# Patient Record
Sex: Female | Born: 1945 | Race: Black or African American | Hispanic: No | Smoking: Never smoker
Health system: Southern US, Community
[De-identification: ages and names within clinical notes are randomized; demographics above are authoritative.]

## PROBLEM LIST (undated history)

## (undated) DIAGNOSIS — I10 Essential (primary) hypertension: Secondary | ICD-10-CM

## (undated) DIAGNOSIS — C55 Malignant neoplasm of uterus, part unspecified: Secondary | ICD-10-CM

## (undated) DIAGNOSIS — E119 Type 2 diabetes mellitus without complications: Secondary | ICD-10-CM

## (undated) DIAGNOSIS — Z9221 Personal history of antineoplastic chemotherapy: Secondary | ICD-10-CM

## (undated) HISTORY — DX: Essential (primary) hypertension: I10

## (undated) HISTORY — DX: Type 2 diabetes mellitus without complications: E11.9

---

## 1978-01-20 HISTORY — PX: TUBAL LIGATION: SHX77

## 2003-02-27 ENCOUNTER — Other Ambulatory Visit: Payer: Self-pay

## 2006-05-07 ENCOUNTER — Ambulatory Visit: Payer: Self-pay

## 2007-03-02 ENCOUNTER — Ambulatory Visit: Payer: Self-pay

## 2009-02-12 ENCOUNTER — Ambulatory Visit: Payer: Self-pay

## 2009-06-28 ENCOUNTER — Emergency Department: Payer: Self-pay | Admitting: Emergency Medicine

## 2010-09-19 ENCOUNTER — Emergency Department: Payer: Self-pay

## 2010-11-07 ENCOUNTER — Ambulatory Visit: Payer: Self-pay | Admitting: Orthopedic Surgery

## 2011-01-21 HISTORY — PX: HAND SURGERY: SHX662

## 2011-03-20 ENCOUNTER — Ambulatory Visit: Payer: Self-pay | Admitting: Internal Medicine

## 2012-04-09 ENCOUNTER — Emergency Department: Payer: Self-pay | Admitting: Emergency Medicine

## 2012-09-03 ENCOUNTER — Ambulatory Visit: Payer: Self-pay | Admitting: Internal Medicine

## 2012-09-08 ENCOUNTER — Ambulatory Visit: Payer: Self-pay | Admitting: General Surgery

## 2012-09-21 ENCOUNTER — Encounter: Payer: Self-pay | Admitting: General Surgery

## 2012-09-21 ENCOUNTER — Other Ambulatory Visit: Payer: Self-pay | Admitting: General Surgery

## 2012-09-21 ENCOUNTER — Ambulatory Visit (INDEPENDENT_AMBULATORY_CARE_PROVIDER_SITE_OTHER): Payer: Medicare Other | Admitting: General Surgery

## 2012-09-21 ENCOUNTER — Ambulatory Visit: Payer: Self-pay | Admitting: General Surgery

## 2012-09-21 VITALS — BP 140/70 | HR 72 | Resp 14 | Ht 69.0 in | Wt 203.0 lb

## 2012-09-21 DIAGNOSIS — K8070 Calculus of gallbladder and bile duct without cholecystitis without obstruction: Secondary | ICD-10-CM

## 2012-09-21 DIAGNOSIS — K429 Umbilical hernia without obstruction or gangrene: Secondary | ICD-10-CM

## 2012-09-21 NOTE — Patient Instructions (Addendum)
Laparoscopic Cholecystectomy Laparoscopic cholecystectomy is surgery to remove the gallbladder. The gallbladder is located slightly to the right of center in the abdomen, behind the liver. It is a concentrating and storage sac for the bile produced in the liver. Bile aids in the digestion and absorption of fats. Gallbladder disease (cholecystitis) is an inflammation of your gallbladder. This condition is usually caused by a buildup of gallstones (cholelithiasis) in your gallbladder. Gallstones can block the flow of bile, resulting in inflammation and pain. In severe cases, emergency surgery may be required. When emergency surgery is not required, you will have time to prepare for the procedure. Laparoscopic surgery is an alternative to open surgery. Laparoscopic surgery usually has a shorter recovery time. Your common bile duct may also need to be examined and explored. Your caregiver will discuss this with you if he or she feels this should be done. If stones are found in the common bile duct, they may be removed. LET YOUR CAREGIVER KNOW ABOUT:  Allergies to food or medicine.  Medicines taken, including vitamins, herbs, eyedrops, over-the-counter medicines, and creams.  Use of steroids (by mouth or creams).  Previous problems with anesthetics or numbing medicines.  History of bleeding problems or blood clots.  Previous surgery.  Other health problems, including diabetes and kidney problems.  Possibility of pregnancy, if this applies. RISKS AND COMPLICATIONS All surgery is associated with risks. Some problems that may occur following this procedure include:  Infection.  Damage to the common bile duct, nerves, arteries, veins, or other internal organs such as the stomach or intestines.  Bleeding.  A stone may remain in the common bile duct. BEFORE THE PROCEDURE  Do not take aspirin for 3 days prior to surgery or blood thinners for 1 week prior to surgery.  Do not eat or drink  anything after midnight the night before surgery.  Let your caregiver know if you develop a cold or other infectious problem prior to surgery.  You should be present 60 minutes before the procedure or as directed. PROCEDURE  You will be given medicine that makes you sleep (general anesthetic). When you are asleep, your surgeon will make several small cuts (incisions) in your abdomen. One of these incisions is used to insert a small, lighted scope (laparoscope) into the abdomen. The laparoscope helps the surgeon see into your abdomen. Carbon dioxide gas will be pumped into your abdomen. The gas allows more room for the surgeon to perform your surgery. Other operating instruments are inserted through the other incisions. Laparoscopic procedures may not be appropriate when:  There is major scarring from previous surgery.  The gallbladder is extremely inflamed.  There are bleeding disorders or unexpected cirrhosis of the liver.  A pregnancy is near term.  Other conditions make the laparoscopic procedure impossible. If your surgeon feels it is not safe to continue with a laparoscopic procedure, he or she will perform an open abdominal procedure. In this case, the surgeon will make an incision to open the abdomen. This gives the surgeon a larger view and field to work within. This may allow the surgeon to perform procedures that sometimes cannot be performed with a laparoscope alone. Open surgery has a longer recovery time. AFTER THE PROCEDURE  You will be taken to the recovery area where a nurse will watch and check your progress.  You may be allowed to go home the same day.  Do not resume physical activities until directed by your caregiver.  You may resume a normal diet and   activities as directed. Document Released: 01/06/2005 Document Revised: 03/31/2011 Document Reviewed: 06/21/2010 [facility] Patient Information 2014 [place], [place].  Endoscopic Retrograde Cholangiopancreatography  (ERCP) ERCP stands for endoscopic retrograde cholangiopancreatography. In this procedure, a thin, lighted tube (endoscope) is used. It is passed through the mouth and down the back of the throat into the upper part of the intestine, called the duodenum. A small, plastic tube (cannula) is then passed through the endoscope. It is directed into the bile duct or pancreatic duct. Dye is then injected through the tube. X-rays are taken to study the biliary and pancreatic passageways. This procedure is used to diagnosis many diseases of the pancreas, bile ducts, liver, and gallbladder. LET YOUR CAREGIVER KNOW ABOUT:   Allergies to food or medicine.  Medicines taken, including vitamins, herbs, eyedrops, over-the-counter medicines, and creams.  Use of steroids (by mouth or creams).  Previous problems with anesthetics or numbing medicines.  History of bleeding problems or blood clots.  Previous surgery.  Other health problems, including diabetes and kidney problems.  Possibility of pregnancy, if this applies.  Any barium X-rays during the past week. BEFORE THE PROCEDURE   Do not eat or drink anything, including water, for at least 6 hours before the procedure.  Ask your caregiver whether you should stop taking certain medicines prior to your procedure.  Arrange for someone to drive you home. You will not be allowed to drive for several hours after the procedure.  Arrive at least 60 minutes before the procedure or as directed. This will give you time to check in and fill out any necessary paperwork. PROCEDURE   You will be given medicine through a vein (intravenously) to make you relaxed and sleepy.  You might have a breathing tube placed to give you medicine that makes you sleep (general anesthetic).  Your throat may be sprayed with medicine that numbs the area (local anesthetic).  You will lie on your left side. The endoscope will be inserted through your mouth and into the duodenum. The  tube will not interfere with your breathing. Gagging is prevented by the anesthesia.  While X-rays are being taken, you may be positioned on your stomach. During the ERCP, the person performing the procedure may identify a blockage or narrowed opening to the bile duct. A muscular portion of the main bile duct may be partially cut (sphincterotomy). A thin, plastic tube (stent) will be positioned inside your bile duct. This is to allow fluid secreted by the liver (bile) to flow more easily through the narrowed opening. AFTER THE PROCEDURE   You will rest in bed until you are fully conscious.  When you first wake up, your throat may feel slightly sore.  You will not be allowed to eat or drink until numbness subsides.  Once you are able to drink, urinate, and sit on the edge of the bed without feeling sick to your stomach (nauseous) or dizzy, you may be allowed to go home.  Have a friend or family member with you for the first 24 hours after your procedure. SEEK MEDICAL CARE IF:   You have an oral temperature above 102 F (38.9 C).  You develop other signs of infection, including chills or feeling unwell.  You have abdominal pain.  You have questions or concerns. MAKE SURE YOU:   Understand these instructions.  Will watch your condition.  Will get help right away if you are not doing well or get worse. Document Released: 10/01/2000 Document Revised: 03/31/2011 Document Reviewed: 04/24/2009 [employer]  Patient Information 2014 [place], [place].  Patient to have labs completed at [facility] today.

## 2012-09-21 NOTE — Progress Notes (Signed)
Patient ID: Frances [name], female   DOB: [birth year], 67 y.o.   MRN: [id number]  Chief Complaint  Patient presents with  . Other    gallstones    HPI Frances [name] is a 67 y.o. female who presents for an evaluation of gallstones. States for about a month she has been having right abdominal pain that radiates to her back.  She has had her gall bladder "flare up" in the past off and on for about 20 years. 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  She does state that the pain episodes are worse with fried foods. Denies vomiting and the nausea doesn't seem to last long. Ultrasound was done 09-03-12.   HPI  Past Medical History  Diagnosis Date  . Hypertension   . Diabetes mellitus without complication     Past Surgical History  Procedure Laterality Date  . Tubal ligation  1980  . Hand surgery Left 2013    Carpel Tunnel     History reviewed. No pertinent family history.  Social History History  Substance Use Topics  . Smoking status: Never Smoker   . Smokeless tobacco: Not on file  . Alcohol Use: No    No Known Allergies  Current Outpatient Prescriptions  Medication Sig Dispense Refill  . amoxicillin (AMOXIL) 500 MG capsule Take 3 capsules by mouth daily.      Marland Kitchen aspirin 81 MG tablet Take 81 mg by mouth daily.      . hydrochlorothiazide (HYDRODIURIL) 25 MG tablet Take 1 tablet by mouth daily.      Marland Kitchen lisinopril (PRINIVIL,ZESTRIL) 40 MG tablet Take 1 tablet by mouth daily.      . metFORMIN (GLUCOPHAGE) 500 MG tablet Take 1 tablet by mouth daily.      . verapamil (VERELAN PM) 240 MG 24 hr capsule Take 1 capsule by mouth daily.       No current facility-administered medications for this visit.    Review of Systems Review of Systems  Constitutional: Negative.   Respiratory: Negative.   Cardiovascular: Negative.   Gastrointestinal: Positive for nausea.    Blood pressure 140/70, pulse 72, resp. rate 14, height 5\' 9"  (1.753 m), weight 203 lb (92.08  kg).  Physical Exam Physical Exam  Constitutional: She is oriented to person, place, and time. She appears [name]-developed and [name]-nourished.  Eyes: Conjunctivae are normal. No scleral icterus.  Neck: Neck supple.  Cardiovascular: Normal rate and regular rhythm.   Occasional drop beat  Pulmonary/Chest: Effort normal and breath sounds normal.  Abdominal: Soft. Normal appearance. There is no tenderness. A hernia (umbilical, 3-4 cm nontender partically reducible ) is present.  Lymphadenopathy:    She has no cervical adenopathy.  Neurological: She is alert and oriented to person, place, and time.  Skin: Skin is warm and dry.    Data Reviewed Ultrasound reviewed showed multiple stones including some in common duct.  Assessment    Patient with biliary colic. Umbilical hernia present.    Plan    LFT's, lipase, if normal can proceed with laparoscopic cholecystectomy and cholangiogram may need ERCP after. If LFT are abnormal then start with ERCP and then surgery.     Patient to have the following labs completed at [facility] today: hepatic function panel A, CBC, and lipase.  Kaydn Kumpf G 09/22/2012, 6:39 AM

## 2012-09-22 ENCOUNTER — Encounter: Payer: Self-pay | Admitting: General Surgery

## 2012-09-22 ENCOUNTER — Telehealth: Payer: Self-pay | Admitting: *Deleted

## 2012-09-22 LAB — HEPATIC FUNCTION PANEL
AST: 20 IU/L (ref 0–40)
Albumin: 4.1 g/dL (ref 3.6–4.8)
Total Bilirubin: 0.2 mg/dL (ref 0.0–1.2)
Total Protein: 6.2 g/dL (ref 6.0–8.5)

## 2012-09-22 LAB — CBC WITH DIFFERENTIAL/PLATELET
Basophils Absolute: 0 10*3/uL (ref 0.0–0.2)
Eos: 4 % (ref 0–5)
HCT: 36.2 % (ref 34.0–46.6)
Immature Grans (Abs): 0 10*3/uL (ref 0.0–0.1)
Immature Granulocytes: 0 % (ref 0–2)
Lymphocytes Absolute: 2.1 10*3/uL (ref 0.7–3.1)
Lymphs: 49 % — ABNORMAL HIGH (ref 14–46)
Monocytes: 8 % (ref 4–12)
RDW: 13.7 % (ref 12.3–15.4)
WBC: 4.2 10*3/uL (ref 3.4–10.8)

## 2012-09-22 NOTE — Telephone Encounter (Signed)
Patient was contacted to arrange gallbladder surgery. She has a daughter that is in the hospital fixing to deliver and wishes to call the office back by the end of the week to arrange surgery.

## 2012-09-24 ENCOUNTER — Telehealth: Payer: Self-pay | Admitting: *Deleted

## 2012-09-24 NOTE — Telephone Encounter (Signed)
Message copied by Nicholes Mango on Fri Sep 24, 2012 10:51 AM ------      Message from: Kieth Brightly      Created: Wed Sep 22, 2012  6:38 AM       LFTs and lipase are normal. Can schedule lap/cholecystectomy with cholangiogram. CBC is abnormal. Need to to get previous CBC if on is available from her PCP Please check on this. ------

## 2012-09-24 NOTE — Telephone Encounter (Signed)
After three phone calls, Dr. Fredna Dow office finally reports that they do not have a previous CBC result on this patient. Patient will be calling to arrange surgery.Thanks.

## 2012-10-01 ENCOUNTER — Telehealth: Payer: Self-pay | Admitting: *Deleted

## 2012-10-01 NOTE — Telephone Encounter (Signed)
Message left for patient to call the office since she has not called back to arrange gallbladder surgery.

## 2012-10-01 NOTE — Telephone Encounter (Signed)
Message copied by Nicholes Mango on Fri Oct 01, 2012  8:56 AM ------      Message from: Kieth Brightly      Created: Wed Sep 22, 2012  6:38 AM       LFTs and lipase are normal. Can schedule lap/cholecystectomy with cholangiogram. CBC is abnormal. Need to to get previous CBC if on is available from her PCP Please check on this. ------

## 2012-10-04 ENCOUNTER — Telehealth: Payer: Self-pay | Admitting: *Deleted

## 2012-10-04 NOTE — Telephone Encounter (Signed)
Patient's surgery has been scheduled for 11-08-12 at [facility]. Paperwork has been mailed to the patient.

## 2012-10-28 ENCOUNTER — Other Ambulatory Visit: Payer: Self-pay | Admitting: General Surgery

## 2012-10-28 DIAGNOSIS — K8010 Calculus of gallbladder with chronic cholecystitis without obstruction: Secondary | ICD-10-CM

## 2012-11-01 ENCOUNTER — Ambulatory Visit: Payer: Self-pay | Admitting: General Surgery

## 2012-11-01 LAB — BASIC METABOLIC PANEL
Anion Gap: 6 — ABNORMAL LOW (ref 7–16)
BUN: 17 mg/dL (ref 7–18)
Chloride: 105 mmol/L (ref 98–107)
Co2: 29 mmol/L (ref 21–32)
Creatinine: 0.94 mg/dL (ref 0.60–1.30)
EGFR (African American): >60
EGFR (Non-African Amer.): >60
Glucose: 101 mg/dL — ABNORMAL HIGH (ref 65–99)
Sodium: 140 mmol/L (ref 136–145)

## 2012-11-08 ENCOUNTER — Encounter: Payer: Self-pay | Admitting: General Surgery

## 2012-11-08 ENCOUNTER — Ambulatory Visit: Payer: Self-pay | Admitting: General Surgery

## 2012-11-08 DIAGNOSIS — K430 Incisional hernia with obstruction, without gangrene: Secondary | ICD-10-CM

## 2012-11-08 DIAGNOSIS — K8010 Calculus of gallbladder with chronic cholecystitis without obstruction: Secondary | ICD-10-CM

## 2012-11-08 HISTORY — PX: CHOLECYSTECTOMY: SHX55

## 2012-11-08 LAB — COMPREHENSIVE METABOLIC PANEL
Albumin: 3.1 g/dL — ABNORMAL LOW (ref 3.4–5.0)
Anion Gap: 7 (ref 7–16)
Bilirubin,Total: 1.1 mg/dL — ABNORMAL HIGH (ref 0.2–1.0)
Calcium, Total: 8.8 mg/dL (ref 8.5–10.1)
Chloride: 104 mmol/L (ref 98–107)
Co2: 28 mmol/L (ref 21–32)
Creatinine: 1.21 mg/dL (ref 0.60–1.30)
EGFR (Non-African Amer.): 46 — ABNORMAL LOW
Glucose: 160 mg/dL — ABNORMAL HIGH (ref 65–99)
Osmolality: 285 (ref 275–301)
Sodium: 139 mmol/L (ref 136–145)

## 2012-11-08 LAB — CBC WITH DIFFERENTIAL/PLATELET
Basophil #: 0 10*3/uL (ref 0.0–0.1)
Basophil %: 0.4 %
HCT: 34.9 % — ABNORMAL LOW (ref 35.0–47.0)
Lymphocyte #: 0.8 10*3/uL — ABNORMAL LOW (ref 1.0–3.6)
Lymphocyte %: 8.5 %
MCHC: 34.6 g/dL (ref 32.0–36.0)
Monocyte #: 0.5 x10 3/mm (ref 0.2–0.9)
Neutrophil #: 8.1 10*3/uL — ABNORMAL HIGH (ref 1.4–6.5)
RBC: 3.55 10*6/uL — ABNORMAL LOW (ref 3.80–5.20)

## 2012-11-09 ENCOUNTER — Other Ambulatory Visit: Payer: Self-pay | Admitting: General Surgery

## 2012-11-09 DIAGNOSIS — R110 Nausea: Secondary | ICD-10-CM

## 2012-11-09 LAB — HEPATIC FUNCTION PANEL A (ARMC)
Albumin: 2.6 g/dL — ABNORMAL LOW (ref 3.4–5.0)
Alkaline Phosphatase: 143 U/L — ABNORMAL HIGH (ref 50–136)
Bilirubin, Direct: 1.2 mg/dL — ABNORMAL HIGH (ref 0.00–0.20)

## 2012-11-09 LAB — LIPASE, BLOOD: Lipase: 92 U/L (ref 73–393)

## 2012-11-09 LAB — PATHOLOGY REPORT

## 2012-11-09 MED ORDER — ONDANSETRON HCL 4 MG PO TABS: 4.0000 mg | ORAL_TABLET | Freq: Three times a day (TID) | ORAL | Status: DC | PRN

## 2012-11-10 ENCOUNTER — Encounter: Payer: Self-pay | Admitting: General Surgery

## 2012-11-10 ENCOUNTER — Ambulatory Visit: Payer: Self-pay | Admitting: Gastroenterology

## 2012-11-11 ENCOUNTER — Inpatient Hospital Stay: Payer: Self-pay | Admitting: General Surgery

## 2012-11-11 LAB — URINALYSIS, COMPLETE
Bacteria: NONE SEEN
Bilirubin,UR: NEGATIVE
Protein: 30
RBC,UR: 60 /HPF (ref 0–5)
WBC UR: 29 /HPF (ref 0–5)

## 2012-11-11 LAB — COMPREHENSIVE METABOLIC PANEL
Alkaline Phosphatase: 217 U/L — ABNORMAL HIGH (ref 50–136)
Anion Gap: 9 (ref 7–16)
Bilirubin,Total: 0.9 mg/dL (ref 0.2–1.0)
Co2: 27 mmol/L (ref 21–32)
EGFR (African American): >60
EGFR (Non-African Amer.): >60
Glucose: 163 mg/dL — ABNORMAL HIGH (ref 65–99)
Potassium: 3.6 mmol/L (ref 3.5–5.1)
SGOT(AST): 130 U/L — ABNORMAL HIGH (ref 15–37)

## 2012-11-11 LAB — CBC
HCT: 41.4 % (ref 35.0–47.0)
MCH: 33.7 pg (ref 26.0–34.0)
MCHC: 34.4 g/dL (ref 32.0–36.0)
RBC: 4.22 10*6/uL (ref 3.80–5.20)
RDW: 12.8 % (ref 11.5–14.5)
WBC: 10.1 10*3/uL (ref 3.6–11.0)

## 2012-11-11 LAB — LIPASE, BLOOD: Lipase: 88 U/L (ref 73–393)

## 2012-11-11 LAB — TROPONIN I: Troponin-I: <0.02 ng/mL

## 2012-11-12 DIAGNOSIS — K430 Incisional hernia with obstruction, without gangrene: Secondary | ICD-10-CM

## 2012-11-12 LAB — BASIC METABOLIC PANEL
Anion Gap: 6 — ABNORMAL LOW (ref 7–16)
Co2: 30 mmol/L (ref 21–32)
Creatinine: 0.97 mg/dL (ref 0.60–1.30)
EGFR (Non-African Amer.): >60
Glucose: 124 mg/dL — ABNORMAL HIGH (ref 65–99)
Potassium: 3.7 mmol/L (ref 3.5–5.1)
Sodium: 141 mmol/L (ref 136–145)

## 2012-11-13 LAB — BASIC METABOLIC PANEL
Anion Gap: 4 — ABNORMAL LOW (ref 7–16)
BUN: 22 mg/dL — ABNORMAL HIGH (ref 7–18)
Calcium, Total: 8.7 mg/dL (ref 8.5–10.1)
Co2: 32 mmol/L (ref 21–32)
Creatinine: 0.96 mg/dL (ref 0.60–1.30)
EGFR (African American): >60
Glucose: 112 mg/dL — ABNORMAL HIGH (ref 65–99)
Potassium: 3.6 mmol/L (ref 3.5–5.1)
Sodium: 140 mmol/L (ref 136–145)

## 2012-11-13 LAB — CBC WITH DIFFERENTIAL/PLATELET
Basophil #: 0 10*3/uL (ref 0.0–0.1)
Eosinophil %: 0 %
HGB: 10.9 g/dL — ABNORMAL LOW (ref 12.0–16.0)
Lymphocyte #: 1.4 10*3/uL (ref 1.0–3.6)
MCH: 35 pg — ABNORMAL HIGH (ref 26.0–34.0)
MCHC: 35.5 g/dL (ref 32.0–36.0)
Monocyte #: 0.8 x10 3/mm (ref 0.2–0.9)
Monocyte %: 9.5 %
Neutrophil %: 73.3 %
RDW: 12.7 % (ref 11.5–14.5)
WBC: 8.3 10*3/uL (ref 3.6–11.0)

## 2012-11-15 ENCOUNTER — Encounter: Payer: Self-pay | Admitting: General Surgery

## 2012-11-15 LAB — CBC WITH DIFFERENTIAL/PLATELET
Eosinophil %: 1.5 %
HGB: 11.8 g/dL — ABNORMAL LOW (ref 12.0–16.0)
Lymphocyte #: 1.2 10*3/uL (ref 1.0–3.6)
Lymphocyte %: 22.6 %
MCH: 34.2 pg — ABNORMAL HIGH (ref 26.0–34.0)
MCHC: 35.3 g/dL (ref 32.0–36.0)
Monocyte #: 0.6 x10 3/mm (ref 0.2–0.9)
Monocyte %: 10.4 %
Neutrophil #: 3.5 10*3/uL (ref 1.4–6.5)
Neutrophil %: 64.6 %
Platelet: 173 10*3/uL (ref 150–440)
RBC: 3.45 10*6/uL — ABNORMAL LOW (ref 3.80–5.20)
RDW: 12.7 % (ref 11.5–14.5)

## 2012-11-15 LAB — BASIC METABOLIC PANEL
Anion Gap: 4 — ABNORMAL LOW (ref 7–16)
Calcium, Total: 8.3 mg/dL — ABNORMAL LOW (ref 8.5–10.1)
Creatinine: 0.87 mg/dL (ref 0.60–1.30)
EGFR (Non-African Amer.): >60
Sodium: 137 mmol/L (ref 136–145)

## 2012-11-17 ENCOUNTER — Ambulatory Visit: Payer: Medicare Other | Admitting: General Surgery

## 2012-11-25 ENCOUNTER — Encounter: Payer: Self-pay | Admitting: General Surgery

## 2012-11-25 ENCOUNTER — Ambulatory Visit (INDEPENDENT_AMBULATORY_CARE_PROVIDER_SITE_OTHER): Payer: Medicare Other | Admitting: General Surgery

## 2012-11-25 VITALS — BP 128/68 | HR 76 | Resp 12 | Ht 69.0 in | Wt 195.0 lb

## 2012-11-25 DIAGNOSIS — K8070 Calculus of gallbladder and bile duct without cholecystitis without obstruction: Secondary | ICD-10-CM

## 2012-11-25 DIAGNOSIS — K436 Other and unspecified ventral hernia with obstruction, without gangrene: Secondary | ICD-10-CM | POA: Insufficient documentation

## 2012-11-25 DIAGNOSIS — R350 Frequency of micturition: Secondary | ICD-10-CM | POA: Insufficient documentation

## 2012-11-25 NOTE — Patient Instructions (Addendum)
The patient is aware to call back for any questions or concerns.  Patient to have labs drawn at [facility] lab and return to the office in 3 weeks.

## 2012-11-25 NOTE — Progress Notes (Signed)
This is a 67 year old female here today for her post operative laparoscopic cholecystectomy done on 11/12/12. Patient states she is doing well. States she is having urinary concentration, frequency with an odor. States she is feeling fatigued.  Patient was found to have multiple stones even in the common bile duct. Also, at surgery she had a lot of adhesion of the omentum and to the anterior abdominal wall. These were lysed and noted to have 3 hernial defects. Post cholecystectomy she had ERCP with stent placement.  Two days later she developed herniation of small bowel into hernia defect and under went laparoscopic repair of this with Physio mesh.  She is looking better now. Starting to eat better. Only c/o decreased strength and increased urinary frequency. Urine also has an odor. Abdomen is soft, portsites are all clean. Hernia repair is intact. Lungs clear.  Overall doing well post surgery/ERCP  Vernona Rieger at Dr. Nigel Bridgeman office has been contacted and is aware that the patient's stent can be removed in one month. This can go ahead and be arranged. Dr. Bluford Kaufmann will not need to see patient prior.   Patient to have the following labs drawn at [facility] lab: CBC, Hepatic Function Panel, urinalysis, and urine culture.  Return to the office in 3 weeks.

## 2012-11-26 LAB — URINALYSIS
Bilirubin, UA: NEGATIVE
Nitrite, UA: NEGATIVE
Specific Gravity, UA: 1.021 (ref 1.005–1.030)
pH, UA: 7.5 (ref 5.0–7.5)

## 2012-11-26 LAB — HEPATIC FUNCTION PANEL
AST: 17 IU/L (ref 0–40)
Albumin: 3.8 g/dL (ref 3.6–4.8)
Alkaline Phosphatase: 85 IU/L (ref 39–117)
Bilirubin, Direct: 0.21 mg/dL (ref 0.00–0.40)
Total Protein: 6.1 g/dL (ref 6.0–8.5)

## 2012-11-26 LAB — CBC
Hemoglobin: 11.7 g/dL (ref 11.1–15.9)
MCHC: 34.5 g/dL (ref 31.5–35.7)
Platelets: 344 10*3/uL (ref 150–379)
RBC: 3.52 x10E6/uL — ABNORMAL LOW (ref 3.77–5.28)
WBC: 6.3 10*3/uL (ref 3.4–10.8)

## 2012-11-26 LAB — URINE CULTURE: Organism ID, Bacteria: NO GROWTH

## 2012-11-29 NOTE — Progress Notes (Signed)
Quick Note:  Inform pt labs are normal. Urine culture with no growth. She does not need abtibiotics. F/u as scheduled ______

## 2012-11-30 ENCOUNTER — Telehealth: Payer: Self-pay | Admitting: *Deleted

## 2012-11-30 NOTE — Telephone Encounter (Signed)
Notified patient as instructed, patient pleased. Discussed follow-up appointments, patient agrees. She started drinking a lot of water and she never started the antibiotics because the next day she felt much better.

## 2012-11-30 NOTE — Telephone Encounter (Signed)
Message copied by Currie Paris on Tue Nov 30, 2012  8:16 AM ------      Message from: Kieth Brightly      Created: Mon Nov 29, 2012  8:22 AM       Inform pt labs are normal. Urine culture with no growth. She does not need abtibiotics.  F/u as scheduled ------

## 2012-12-23 ENCOUNTER — Ambulatory Visit (INDEPENDENT_AMBULATORY_CARE_PROVIDER_SITE_OTHER): Payer: Medicare Other | Admitting: General Surgery

## 2012-12-23 ENCOUNTER — Encounter: Payer: Self-pay | Admitting: General Surgery

## 2012-12-23 VITALS — BP 130/78 | HR 74 | Resp 14 | Ht 69.0 in | Wt 191.0 lb

## 2012-12-23 DIAGNOSIS — K436 Other and unspecified ventral hernia with obstruction, without gangrene: Secondary | ICD-10-CM

## 2012-12-23 NOTE — Progress Notes (Signed)
This is a 67 year old female here today for her post operative laparoscopic cholecystectomy done on 11/12/12. Patient states she is doing well. She also subsequently had ERCP with stent placement and followed by repair of incisional hernia with mesh. Exam shows a soft abdomen. Mild fullness noted to the right and inferior to the umbilicus where she had the hernia. The repair, however, seems to be intact.  She is scheduled for removal of biliary stent on December 11th.  Follow up in 3 months.

## 2012-12-30 ENCOUNTER — Ambulatory Visit: Payer: Self-pay | Admitting: Gastroenterology

## 2013-03-24 ENCOUNTER — Ambulatory Visit: Payer: Medicare Other | Admitting: General Surgery

## 2013-04-06 ENCOUNTER — Ambulatory Visit: Payer: Medicare Other | Admitting: General Surgery

## 2013-04-20 ENCOUNTER — Encounter: Payer: Self-pay | Admitting: *Deleted

## 2013-05-17 ENCOUNTER — Encounter: Payer: Self-pay | Admitting: General Surgery

## 2013-05-17 ENCOUNTER — Ambulatory Visit (INDEPENDENT_AMBULATORY_CARE_PROVIDER_SITE_OTHER): Payer: Medicare Other | Admitting: General Surgery

## 2013-05-17 VITALS — BP 134/72 | HR 74 | Resp 14 | Ht 68.0 in | Wt 189.0 lb

## 2013-05-17 DIAGNOSIS — R222 Localized swelling, mass and lump, trunk: Secondary | ICD-10-CM

## 2013-05-17 DIAGNOSIS — R1900 Intra-abdominal and pelvic swelling, mass and lump, unspecified site: Secondary | ICD-10-CM

## 2013-05-17 NOTE — Progress Notes (Addendum)
Patient ID: Frances [name], female   DOB: 12-24-1945, 68 y.o.   MRN: [id number]  Chief Complaint  Patient presents with  . Follow-up    incisional hernia    HPI Frances [name] is a 68 y.o. female here today for her 3 month follow up on  abdominal wall mass- this is residual after she had lap incisional hernia repair. Patient states the area is still very tender.HPI  Past Medical History  Diagnosis Date  . Hypertension   . Diabetes mellitus without complication     Past Surgical History  Procedure Laterality Date  . Tubal ligation  1980  . Hand surgery Left 2013    Carpel Tunnel   . Cholecystectomy  11-08-12    ERCP with stent(Dr Oh) and  Physiomesh herniation defect repair    History reviewed. No pertinent family history.  Social History History  Substance Use Topics  . Smoking status: Never Smoker   . Smokeless tobacco: Never Used  . Alcohol Use: No    No Known Allergies  Current Outpatient Prescriptions  Medication Sig Dispense Refill  . aspirin 81 MG tablet Take 81 mg by mouth daily.      . hydrochlorothiazide (HYDRODIURIL) 25 MG tablet Take 1 tablet by mouth daily.      Marland Kitchen lisinopril (PRINIVIL,ZESTRIL) 40 MG tablet Take 1 tablet by mouth daily.      . meloxicam (MOBIC) 7.5 MG tablet Take 1 tablet by mouth daily.      . metFORMIN (GLUCOPHAGE) 500 MG tablet Take 1 tablet by mouth daily.      . verapamil (VERELAN PM) 240 MG 24 hr capsule Take 1 capsule by mouth daily.       No current facility-administered medications for this visit.    Review of Systems Review of Systems  Respiratory: Negative.   Cardiovascular: Negative.     Blood pressure 134/72, pulse 74, resp. rate 14, height 5\' 8"  (1.727 m), weight 189 lb (85.73 kg).  Physical Exam Physical Exam  Constitutional: She is oriented to person, place, and time. She appears well-developed and well-nourished.  Eyes: Conjunctivae are normal.  Neck: Neck supple.  Cardiovascular: Normal rate, regular rhythm  and normal heart sounds.   Pulmonary/Chest: Effort normal and breath sounds normal.  Abdominal: Soft. Normal appearance and bowel sounds are normal. There is no hepatomegaly. There is tenderness.  Swelling below the umbilicus uncharged from four months ago.   Lymphadenopathy:    She has no cervical adenopathy.  Neurological: She is alert and oriented to person, place, and time.  Skin: Skin is warm and dry.    Data Reviewed None  Assessment    Likely persistant hernia sac-mildly tender.    Plan   Discussed Excision of this mass as it appears to be symptomatic. She is agreeable.  Patient is scheduled for surgery at [facility] on 06/15/13. She will Pre Admit at the hospital on 06/07/13 at 9:00 am. She is aware of dates, time, and instructions.       Mercy Leppla G Karen Huhta 05/31/2013, 12:54 PM

## 2013-05-17 NOTE — Patient Instructions (Addendum)
Patient to be scheduled for a abdominal wall mass excision  Patient is scheduled for surgery at [facility] on 06/15/13. She will Pre Admit at the hospital on 06/07/13 at 9:00 am. She is aware of dates, time, and instructions.

## 2013-05-18 ENCOUNTER — Encounter: Payer: Self-pay | Admitting: General Surgery

## 2013-05-31 ENCOUNTER — Other Ambulatory Visit: Payer: Self-pay | Admitting: General Surgery

## 2013-05-31 DIAGNOSIS — R222 Localized swelling, mass and lump, trunk: Secondary | ICD-10-CM

## 2013-06-07 ENCOUNTER — Ambulatory Visit: Payer: Self-pay | Admitting: General Surgery

## 2013-06-15 ENCOUNTER — Ambulatory Visit: Payer: Self-pay | Admitting: General Surgery

## 2013-06-15 DIAGNOSIS — R1900 Intra-abdominal and pelvic swelling, mass and lump, unspecified site: Secondary | ICD-10-CM

## 2013-06-16 ENCOUNTER — Encounter: Payer: Self-pay | Admitting: General Surgery

## 2013-06-16 LAB — PATHOLOGY REPORT

## 2013-06-17 ENCOUNTER — Encounter: Payer: Self-pay | Admitting: General Surgery

## 2013-06-22 ENCOUNTER — Encounter: Payer: Self-pay | Admitting: General Surgery

## 2013-06-22 ENCOUNTER — Ambulatory Visit (INDEPENDENT_AMBULATORY_CARE_PROVIDER_SITE_OTHER): Payer: Medicare Other | Admitting: General Surgery

## 2013-06-22 VITALS — BP 152/78 | HR 74 | Resp 14 | Ht 68.0 in | Wt 190.0 lb

## 2013-06-22 DIAGNOSIS — R1900 Intra-abdominal and pelvic swelling, mass and lump, unspecified site: Secondary | ICD-10-CM

## 2013-06-22 DIAGNOSIS — R222 Localized swelling, mass and lump, trunk: Secondary | ICD-10-CM

## 2013-06-22 NOTE — Patient Instructions (Signed)
Call for any problems with incision

## 2013-06-22 NOTE — Progress Notes (Signed)
This is a 68 year old female here today for her post op excision of residual hernia sac with seroma  done on 06/15/13. The prior hernia repair with mesh is intact  Patient states she is doing well.  Abdomen is soft.  Incision is clean and healing well.  Patient to return in three weeks

## 2013-07-18 ENCOUNTER — Ambulatory Visit (INDEPENDENT_AMBULATORY_CARE_PROVIDER_SITE_OTHER): Payer: Self-pay | Admitting: General Surgery

## 2013-07-18 ENCOUNTER — Encounter: Payer: Self-pay | Admitting: General Surgery

## 2013-07-18 VITALS — BP 136/76 | HR 76 | Resp 12 | Ht 68.0 in | Wt 188.0 lb

## 2013-07-18 DIAGNOSIS — R222 Localized swelling, mass and lump, trunk: Secondary | ICD-10-CM

## 2013-07-18 DIAGNOSIS — R1900 Intra-abdominal and pelvic swelling, mass and lump, unspecified site: Secondary | ICD-10-CM

## 2013-07-18 NOTE — Patient Instructions (Signed)
Patient to return in two months.  

## 2013-07-18 NOTE — Progress Notes (Signed)
This is a 68 year old female here today for her post op excision of residual hernia sac with seroma done on 06/15/13.  The prior hernia repair with mesh is intact, there is reduction in size on the residual hernia sac . No new signs noticed.  Patient states she is doing well.  Patient to return in two months.

## 2013-09-19 ENCOUNTER — Ambulatory Visit: Payer: Medicare Other | Admitting: General Surgery

## 2013-10-03 ENCOUNTER — Ambulatory Visit: Payer: Medicare Other | Admitting: General Surgery

## 2013-10-05 ENCOUNTER — Encounter: Payer: Self-pay | Admitting: General Surgery

## 2013-10-05 ENCOUNTER — Ambulatory Visit (INDEPENDENT_AMBULATORY_CARE_PROVIDER_SITE_OTHER): Payer: Self-pay | Admitting: General Surgery

## 2013-10-05 VITALS — BP 132/82 | HR 64 | Resp 12 | Ht 68.0 in | Wt 186.0 lb

## 2013-10-05 DIAGNOSIS — R222 Localized swelling, mass and lump, trunk: Secondary | ICD-10-CM

## 2013-10-05 DIAGNOSIS — R1900 Intra-abdominal and pelvic swelling, mass and lump, unspecified site: Secondary | ICD-10-CM

## 2013-10-05 NOTE — Progress Notes (Signed)
Patient ID: Frances [name], female   DOB: 10/17/1945, 68 y.o.   MRN: [id number]  Chief Complaint  Patient presents with  . Routine Post Op    abdomen biopsy     HPI Frances [name] is a 68 y.o. female  here today for her post op excision of residual hernia sac with seroma done on 06/15/13. Patient states she is doing well with no new complaints.    HPI  Past Medical History  Diagnosis Date  . Hypertension   . Diabetes mellitus without complication     Past Surgical History  Procedure Laterality Date  . Tubal ligation  1980  . Hand surgery Left 2013    Carpel Tunnel   . Cholecystectomy  11-08-12    ERCP with stent(Dr Oh) and  Physiomesh herniation defect repair    History reviewed. No pertinent family history.  Social History History  Substance Use Topics  . Smoking status: Never Smoker   . Smokeless tobacco: Never Used  . Alcohol Use: No    No Known Allergies  Current Outpatient Prescriptions  Medication Sig Dispense Refill  . aspirin 81 MG tablet Take 81 mg by mouth daily.      . hydrochlorothiazide (HYDRODIURIL) 25 MG tablet Take 1 tablet by mouth daily.      Marland Kitchen lisinopril (PRINIVIL,ZESTRIL) 40 MG tablet Take 1 tablet by mouth daily.      . meloxicam (MOBIC) 7.5 MG tablet Take 1 tablet by mouth daily.      . metFORMIN (GLUCOPHAGE) 500 MG tablet Take 1 tablet by mouth daily.      . verapamil (VERELAN PM) 240 MG 24 hr capsule Take 1 capsule by mouth daily.       No current facility-administered medications for this visit.    Review of Systems Review of Systems  Constitutional: Negative.   Respiratory: Negative.   Cardiovascular: Negative.     Blood pressure 132/82, pulse 64, resp. rate 12, height 5\' 8"  (1.727 m), weight 186 lb (84.369 kg).  Physical Exam Physical Exam  Constitutional: She is oriented to person, place, and time. She appears well-developed and well-nourished.  Abdominal: Soft. Bowel sounds are normal. There is no tenderness.   Neurological: She is alert and oriented to person, place, and time.  Skin: Skin is warm and dry.  The prior hernia repair with mesh is intact. No new signs noticed. No papilable seroma at the umbilicus    Data Reviewed None  Assessment     Stable repair of local hernia.     Plan   Patient to return as needed.         Eirik Schueler G 10/05/2013, 10:03 AM

## 2013-10-05 NOTE — Patient Instructions (Signed)
Patient to return as needed. 

## 2013-11-21 ENCOUNTER — Encounter: Payer: Self-pay | Admitting: General Surgery

## 2014-05-12 NOTE — Consult Note (Signed)
PATIENT NAME:  Frances [name], Frances [name] MR#:  [id number] DATE OF BIRTH:  02-Jul-1945  DATE OF CONSULTATION:  11/08/2012  REFERRING PHYSICIAN:   CONSULTING PHYSICIAN:  Lupita Dawn. Candace Cruise, MD  Frances [name] is a 69 year old black female, who underwent a laparoscopic cholecystectomy earlier today for chronic cholecystitis and cholelithiasis. She also has evidence of abdominal hernia. At the time of cholecystectomy, intraoperative cholangiogram was done. Cholangiogram showed a dilated common bile duct with distal filling defects. Therefore, I was asked to evaluate the patient for possible ERCP.   The patient is still fairly groggy from the surgery. She has some discomfort at the surgical site. Otherwise, she has no symptoms. She had liver enzymes checked earlier a few weeks ago. At that time, everything was normal. Labs are repeated today. This showed a sodium of 139, potassium 3.1, creatinine 1.2, glucose 160, bilirubin is 1.1, AST 612 and ALT 295. White count is 9.5, hemoglobin 12.1.   REVIEW OF SYSTEMS: There are no fevers or chills or nausea or vomiting. There is no chest pain or palpitations, cough or shortness of breath. No visual or hearing changes. There is some abdominal discomfort from the surgery itself. There is no gross hematochezia or melena.   MEDICATIONS: She normally takes at home include metformin 500 mg twice a day, baby aspirin daily, Tylenol p.r.n., lisinopril 40 mg daily, Verapamil 240 mg daily and hydrochlorothiazide 25 mg daily.   ALLERGIES: She has no known drug allergies.  SOCIAL HISTORY:  She denies smoking and drinking. She does have a history of hypertension.   PHYSICAL EXAMINATION:  GENERAL: The patient is in no acute distress. Again, she is somewhat groggy.  VITAL SIGNS: She is afebrile. Vital signs are stable.  CARDIAC: Regular rhythm and rate.  LUNGS: Clear bilaterally.  ABDOMEN: Normoactive bowel sounds, soft. There is some tenderness in the upper abdomen where the surgery was  done.  EXTREMITIES: No clubbing, cyanosis, or edema.   LABS: Already described as above.   IMPRESSION: This is a patient, who underwent a cholecystectomy earlier today, who has evidence of choledocholithiasis. The patient does need endoscopic retrograde cholangiopancreatography. I discussed endoscopic retrograde cholangiopancreatography with the patient and her daughter, who came in later. Discussed the potential risks including pancreatitis. The patient will have an endoscopic retrograde cholangiopancreatography scheduled since I am not available tomorrow here for endoscopic retrograde cholangiopancreatography. I will schedule her on Wednesday. We will see how she does postop. If she does okay and there is no evidence of cholangitis, then she can be discharged and brought back to have endoscopic retrograde cholangiopancreatography as an outpatient. If she has symptoms or the liver enzymes go up,  then we may keep her here until we do the endoscopic retrograde cholangiopancreatography on Wednesday.  Thank you for the referral.  ____________________________ Lupita Dawn. Candace Cruise, MD pyo:aw D: 11/10/2012 08:30:51 ET T: 11/10/2012 08:47:48 ET JOB#: [id number]  cc: Lupita Dawn. Candace Cruise, MD, <Dictator> Lupita Dawn Sophie Quiles MD ELECTRONICALLY SIGNED 11/11/2012 8:56

## 2014-05-12 NOTE — Consult Note (Signed)
Pt seen and examined. Full consult to follow. Underwent lap choly today with intraop cholangiogram. IOC showed dilated CBD with filling defects, c/w CBD stones. Pt still groggy from anesthesia. Daughter available. Discussed ERCP in detail as well as potential complications. Labs ordered but still pending. Pt with HTN and DM. No drug allergies. I am in [place] tomorrow to do cases. Will plan ERCP on Wed. If patient stable, can be discharged and then brought back on Wed for outpt ERCP. Consider sending patient home on oral Abx.  If labs are grossly abnormal, then may need to do ERCP late tomorrow afternoon either with me or Dr. Allen Norris if anesthesia avaiable. Thanks.   Electronic Signatures: Verdie Shire (MD) (Signed on 20-Oct-14 13:14)  Authored   Last Updated: 20-Oct-14 16:52 by Verdie Shire (MD)

## 2014-05-12 NOTE — Op Note (Signed)
PATIENT NAME:  Frances [name], Frances [name] MR#:  [id number] DATE OF BIRTH:  06-05-1945  DATE OF PROCEDURE:  11/08/2012  PREOPERATIVE DIAGNOSIS: Chronic cholecystitis and cholelithiasis.   POSTOPERATIVE DIAGNOSES: 1.  Chronic cholecystitis with cholelithiasis. 2.  Large hernia in the midportion of the abdomen from previous surgery with adhesions.  3.  Dilated common bile duct with multiple stones.   PROCEDURES PERFORMED: Laparoscopy, lysis of adhesions and reduction of the omental herniation in the mid abdomen, cholecystectomy, intraoperative cholangiogram.   ANESTHESIA: General.   COMPLICATIONS: None.   ESTIMATED BLOOD LOSS: Less than 50 mL.   DRAINS: None.  LENGTH OF PROCEDURE:  A little over 2 hours with bulk of the time of over an hour spent in lysing the adhesions and the reduction of the hernia in the mid abdomen.   DETAILS OF PROCEDURE:  The patient was put to sleep in the supine position on the operating table. The abdomen was prepped and draped out as a sterile field. The timeout procedure was performed. With the patient asleep, it became apparent that the small hernia that was noted near the umbilicus was, in fact, a much larger hernia occupying not only the umbilicus, but also a little bit below to the right side. Accordingly, the initial entry was made in the epigastric region.  With a small stab incision, a Veress needle with the InnerDyne sleeve was positioned, and a 5 mm port was placed and a camera was introduced after pneumoperitoneum was obtained. The peritoneal cavity was adequately visualized, and in order to facilitate the cholecystectomy, it was necessary to reduce and lyse the adhesions of the omentum; a good  portion of which was herniated into the central portion of the abdomen. Accordingly, a 10 mm port was placed on the left side of the abdomen and two 5 mm ports in the right upper quadrant. From here, the adhesions were satisfactorily lysed, but this required a combination of  lysis with cautery, along with gentle pressure from outside to reduce the contents in the hernia. This took approximally an hour, and after this was successfully lysed and removed, portions of the omentum, which appeared to be somewhat chronically scarred, were removed and placed in a retrieval bag and brought out through the 10 mm port site. The fascial defects were actually 3 in number, about 2.5 cm the largest; the smaller ones being about 2 cm, and they were located at the umbilicus and a little bit to the right and inferior of the umbilicus. This allowed for adequate visualization of the gallbladder and subsequently cholecystectomy was performed. The gallbladder was noted to be markedly thickened in its wall without any acute changes and had significant dilatation of the [facility] pouch area. With careful dissection, the Hartmann pouch area was dissected with cephalad traction of the gallbladder. The area of the cystic duct and common duct were identified. The common duct was noted to be moderately dilated, as also the cystic duct. The cystic artery was identified and this was freed, hemoclipped and cut. The posterior branch was also identified, was separately hemoclipped and cut. A Kumar clamp and catheter were positioned. It was noted that the bile within the gallbladder was pretty much mucus suggestive of a mucocele. After attempted cholangiogram with a Kumar clamp, the catheter showed no flow into the bile duct, and it was felt that this was due to obstruction. At this point, the cystic duct was freed and a small opening was made after proximally hemoclipping the duct, and now bile  was noted to be coming through with multiple tiny stones. A Reddick catheter was then positioned and cholangiogram was repeated, which showed a markedly dilated bile duct with multiple filling defects consistent with choledocholithiasis. It did not seem to empty into the duodenum. After this was satisfactorily performed, the  cystic duct was hemoclipped and cut. The gallbladder was dissected free from its bed using cautery for control of bleeding. It was then placed in the retrieval bag and brought out through the port site in the left midabdomen. The fascial opening in the left mid abdominal area was then closed with 2 sutures of 0 Vicryl using a suture passer. The right upper quadrant was irrigated and all fluid suctioned out. After ensuring hemostasis, it was decided not to attempt placement of a mesh in the hernia for a couple of reasons; the patient required an ERCP to address the bile duct stones and possible obstruction, and secondly, with some spillage of bile under pressure, it was felt not suitable to place a synthetic mesh at this time. The plan would be for her to be brought back after her recovery from this procedure and do an elective laparoscopic repair of the hernia. The remaining ports were removed and the pneumoperitoneum was released. All of the skin incisions were closed with subcuticular 4-0 Vicryl, reinforced with Steri-Strips. A dry sterile dressing was placed, and the patient subsequently extubated and returned to the recovery room in stable condition.    ____________________________ S.Robinette Haines, MD sgs:dmm D: 11/09/2012 08:16:02 ET T: 11/09/2012 09:40:43 ET JOB#: [id number]  cc: Synthia Innocent. Jamal Collin, MD, <Dictator> [facility] Robinette Haines MD ELECTRONICALLY SIGNED 11/09/2012 19:04

## 2014-05-12 NOTE — H&P (Signed)
PATIENT NAME:  Frances [name], Frances [name] MR#:  [id number] DATE OF BIRTH:  Apr 15, 1945  DATE OF ADMISSION:  11/11/2012  HISTORY OF PRESENT ILLNESS: This is a 68 year old female who underwent a laparoscopy and cholecystectomy on 11/08/2012, that is 4 days ago. At surgery, it was noted that she had a large portion of the omentum stuck to the mid abdominal area, where she was noted to have a small umbilical hernia. These adhesions had to be lysed in order to complete the cholecystectomy procedure. After reduction of these adhesions to the abdominal wall, it was evident that the patient in fact had 3 small fascial defects in the mid abdomen near the umbilicus, accounting for hernias. No bowel was involved in this. The cholecystectomy was then completed with the finding of significant common bile duct obstruction and stones. There was a significant amount of back pressure, and some spillage of bile was noted in the course of doing a cholangiogram. In view of this, it was felt probably not wise to put a mesh in to cover the hernia at that time, and it was decided that the hernia could be repaired in the next few weeks after she recovers from her cholecystectomy and ERCP. ERCP was completed yesterday, and at that time, it was not clear if the entire bile duct was swept off clear, and accordingly, a stent was placed. Multiple stones and sludge were removed. The patient, since that time, has had some increasing nausea and vomiting and had some significant vomiting today this morning of bilious nature. She presented in the middle of the day to the Emergency Room and subsequently was evaluated and had a CT scan obtained showing evidence of distended small bowel extending up to the level of the hernial defects, some of which contain fatty material and appears to be tapering down and normal after that. The patient has some minimal pain, but pain is not her primary symptom at this point.   PAST MEDICAL HISTORY: The patient has  hypertension and diabetes, both under control. No other major medical illnesses. Further details can be obtained from the recent hospital information.   PHYSICAL EXAMINATION:  GENERAL: The patient is not in any acute distress.  HEENT: Her conjunctivae are pink. Sclerae are clear with no icterus noted.  NECK: Supple. No nodes or masses palpable.  LUNGS: Clear to auscultation and percussion.  HEART: Sinus rhythm without any murmurs.  ABDOMEN: Shows mild distention, particularly in the upper half of the abdomen with some tympany. Bowel sounds are hypoactive. There is the presence of what appears to be some ill-defined fullness near the umbilicus, just superior to this and also a little bit to the right and inferior, but none of this is reducible. The lower abdomen feels relatively soft and uninvolved.   LABORATORY DATA: Reviewed showing a normal white count. Hemoglobin is normal. Chemistries appear to be normal. Liver functions show mild elevation of her enzymes but bilirubin is normal. Lipase is normal. A CT scan was reviewed with the findings as noted in the above HPI.    IMPRESSION: Small bowel obstruction, likely from herniation or adhesion to the area of the lysed omentum at the umbilical ventral abdominal area. This is probably best dealt with by laparoscopy and release of the obstruction and repair of the hernial defects. I have discussed this with the patient and her daughter. They are both agreeable.   PLAN: The patient will be admitted. An NG tube was placed with some bilious material coming through. She will  be maintained on IV fluids and n.p.o. and plan for laparoscopy and possible laparotomy in the morning.   ____________________________ S.Robinette Haines, MD sgs:gb D: 11/11/2012 21:50:29 ET T: 11/11/2012 22:05:55 ET JOB#: [id number]  cc: Synthia Innocent. Jamal Collin, MD, <Dictator> [facility] Robinette Haines MD ELECTRONICALLY SIGNED 11/15/2012 9:00

## 2014-05-12 NOTE — Discharge Summary (Signed)
PATIENT NAME:  Frances [name], SHIFFMAN MR#:  [id number] DATE OF BIRTH:  1945/12/16  DATE OF ADMISSION:  11/11/2012 DATE OF DISCHARGE:  11/17/2012  HISTORY OF PRESENT ILLNESS: This is a 69 year old female who underwent laparoscopy and cholecystectomy. At the time of surgery, multiple omental adhesions to the anterior abdominal wall were encountered. These were lysed and noted that she had 3 fascial defects in the midline, in the mid abdomen. Cholecystectomy was completed. In the course of opening the cystic duct for the cholangiogram, there was a tremendous amount of back pressure with spillage of bile and sludge. Cholangiogram was completed showing multiple filling defects and a dilated common bile duct. Cholecystectomy was completed and it was decided not to attempt to place a mesh over the hernial defects given the bile spillage. The next day the patient underwent ERCP with successful placement of a stent and removal of multiple stones and sludge. This was done on 10/22. A day later the patient developed significant nausea and vomiting and abdominal pain and distention and presented to the Emergency Room. It was noted the patient had findings consistent with small bowel obstruction and a CT showing small bowel incarcerated into one of the hernial defects in the anterior abdominal wall.   PAST MEDICAL HISTORY: The patient had history of diabetes, which is well controlled with oral agent and diet and hypertension which is also under good control. She had no other major medical illnesses.   PHYSICAL EXAMINATION: At time of admission revealed a marked distended abdomen with some tympany and hyperactive bowel sounds and presence of a hernia at the mid abdomen just below and slightly to the right of the umbilicus, but this could not be reduced.  LABORATORY DATA: Revealed a normal white count. Liver function showed only mild elevation of enzymes, but the bilirubin was normal. Lipase was normal.  COURSE IN  HOSPITAL: The patient was admitted, kept n.p.o. on IV fluids, and the next morning she underwent repeat laparoscopy with reduction of the small bowel incarceration and the hernial defect and repair with PHYSIOmesh. Her postoperative course was basically uncomplicated. She did have a period of ileus which finally resolved at which point she was able to start oral intake without any nausea or vomiting or any recurrence of abdominal distention. Other than for discomfort associated with the surgery, the patient was doing fairly well. Her repair remained intact. The patient was discharged home in stable condition on 11/17/2012 to be followed as an outpatient. Arrangements will be made for the patient to have the stent removed in approximately a month from the common bile duct.   FINAL DIAGNOSES: 1.  Small bowel obstruction secondary to incarcerated ventral hernia.  2.  Recent cholecystectomy with ERCP.  3.  History of hypertension.  4.  History of diabetes.   OPERATION PERFORMED: Laparoscopy, repair of incarcerated ventral hernia.  ____________________________ S.Robinette Haines, MD sgs:sb D: 11/30/2012 08:21:31 ET T: 11/30/2012 08:39:16 ET JOB#: [id number]  cc: Synthia Innocent. Jamal Collin, MD, <Dictator> [facility] Robinette Haines MD ELECTRONICALLY SIGNED 11/30/2012 9:52

## 2014-05-12 NOTE — Op Note (Signed)
PATIENT NAME:  Frances [name], Frances [name] MR#:  [id number] DATE OF BIRTH:  April 08, 1945  DATE OF PROCEDURE:  11/12/2012  PREOPERATIVE DIAGNOSIS: Incarcerated ventral hernia.   POSTOPERATIVE DIAGNOSIS: Incarcerated ventral hernia.   OPERATION: Laparoscopy repair of incarcerated ventral hernia.   SURGEON: S.G. Jamal Collin, M.D.   ANESTHESIA: General.   COMPLICATIONS: None.   ESTIMATED BLOOD LOSS: Minimal.   DRAINS: None.   DESCRIPTION OF PROCEDURE: The patient was put to sleep in the supine position on the operating table. The abdomen was prepped and draped out as a sterile field. Timeout procedure was performed. The patient had recently underwent laparoscopy and cholecystectomy during which significant amount of adhesion of omentum to the anterior abdominal wall had to be lysed before the procedure could be completed. She then underwent ERCP for large amount of stones in the bile duct. The patient now developed a herniation of bowel into one of the openings in the midabdominal region accounting for a small bowel obstruction.   Initial port site incision was made in the left upper quadrant. Veress needle with the InnerDyne sleeve was positioned in the peritoneal cavity and verified with the hanging drop method. Pneumoperitoneum was obtained and a 10 mm port was placed. The camera was introduced and there was obvious dilatation of the small bowel identified with some normal appearing bowel distally, 11 mm port in the left mid abdominal area and a 5 mm port in the right side was placed. The entry abdominal wall was inspected. A few recurrent flimsy adhesion of the omentum were taken down. The larger opening located in the inferior most just below the umbilicus and a little to the right contained a loop of small bowel where the obstruction had recurred. This was easily pulled back into the abdominal cavity. The loop contained the dilated bowel along with the distal  normal-appearing bowel. After ensuring that this  obstructive element was resolved, the repair of the hernia was performed. A 15 x 15 cm Physiomesh was placed inside the peritoneal cavity and placed up in the inferior abdominal wall, and with the secure strap it was tacked down all around, with an adequate margin all around the hernial defects. Four stab incisions were made and 0 Prolene stitches were then utilized with the spinal needle introducer and secured the mesh. This was placed in the upper, the lower end and two lateral ones. After this was done, the pneumoperitoneum was released and the ports were removed. The skin incisions were closed with subcuticular 4-0 Vicryl, covered with Dermabond. The procedure was well tolerated. She was subsequently extubated and returned to the recovery room in stable condition.    ____________________________ S.Robinette Haines, MD sgs:sg D: 11/15/2012 08:39:45 ET T: 11/15/2012 09:12:25 ET JOB#: [id number]  cc: Synthia Innocent. Jamal Collin, MD, <Dictator> [facility] Robinette Haines MD ELECTRONICALLY SIGNED 11/15/2012 13:00

## 2014-05-13 NOTE — Op Note (Signed)
PATIENT NAME:  Frances [name], Frances [name] MR#:  [id number] DATE OF BIRTH:  04-05-1945  DATE OF PROCEDURE:  06/15/2013  PREOPERATIVE DIAGNOSIS: Abdominal wall mass at the site of prior hernia repair.   OPERATION PERFORMED: Excision of abdominal wall mass.   SURGEON: S.G. Jamal Collin, M.D.   ANESTHESIA: General.   COMPLICATIONS: None.   ESTIMATED BLOOD LOSS: Minimal.   DRAINS: None.   DESCRIPTION OF PROCEDURE: This patient was put to sleep in the supine position on the operating table. The abdomen was prepped and draped out as a sterile field. Timeout was performed. The patient underwent a laparoscopic repair of incisional hernia located near the umbilicus. This was done with mesh. The patient had persistence of palpable mass at the site of the hernia just below and lateral to the umbilicus and a small one just above the umbilicus- the 2 areas where the defects were identified at hernia repair. It was felt that this likely represented residual hernia tissue, possibly seromas. It was minimally symptomatic and excision was planned.   A skin incision was made vertically from just above the umbilicus and carried around and downward and carefully entered the subcutaneous space with the finding of 2 large seromas in the area of the peritoneal sac that was in this region. With careful dissection and using cautery for control of bleeding, the seroma was completely excised out down to the fascial opening and a smaller one just above the umbilicus was similarly dealt with. The inside area was inspected and the mesh was still intact and there was apparently no hernia at this time. The fascial opening was closed with 0 PDS interrupted stitches. The wound was then closed with 3-0 Vicryl in the subcutaneous tissue and the skin closed with subcuticular 4-0 Monocryl covered with Dermabond. The procedure was well tolerated. She was subsequently extubated and returned to the recovery room in stable condition.    ____________________________ S.Robinette Haines, MD sgs:aw D: 06/16/2013 08:16:24 ET T: 06/16/2013 08:34:55 ET JOB#: [id number]  cc: Synthia Innocent. Jamal Collin, MD, <Dictator> [facility] Robinette Haines MD ELECTRONICALLY SIGNED 06/16/2013 14:02

## 2014-05-17 ENCOUNTER — Emergency Department
Admit: 2014-05-17 | Disposition: A | Discharge status: Home / Self Care | Payer: Self-pay | Admitting: Emergency Medicine

## 2014-07-17 ENCOUNTER — Encounter: Payer: Self-pay | Admitting: *Deleted

## 2014-07-17 ENCOUNTER — Emergency Department
Admission: EM | Admit: 2014-07-17 | Discharge: 2014-07-17 | Disposition: A | Discharge status: Home / Self Care | Payer: Medicare Other | Attending: Emergency Medicine | Admitting: Emergency Medicine

## 2014-07-17 DIAGNOSIS — E119 Type 2 diabetes mellitus without complications: Secondary | ICD-10-CM | POA: Diagnosis not present

## 2014-07-17 DIAGNOSIS — Z7982 Long term (current) use of aspirin: Secondary | ICD-10-CM | POA: Insufficient documentation

## 2014-07-17 DIAGNOSIS — I10 Essential (primary) hypertension: Secondary | ICD-10-CM | POA: Insufficient documentation

## 2014-07-17 DIAGNOSIS — Z79899 Other long term (current) drug therapy: Secondary | ICD-10-CM | POA: Insufficient documentation

## 2014-07-17 DIAGNOSIS — Z791 Long term (current) use of non-steroidal anti-inflammatories (NSAID): Secondary | ICD-10-CM | POA: Insufficient documentation

## 2014-07-17 DIAGNOSIS — R55 Syncope and collapse: Secondary | ICD-10-CM | POA: Insufficient documentation

## 2014-07-17 HISTORY — DX: Malignant neoplasm of uterus, part unspecified: C55

## 2014-07-17 LAB — BASIC METABOLIC PANEL
Anion gap: 9 (ref 5–15)
BUN: 25 mg/dL — AB (ref 6–20)
CALCIUM: 9.4 mg/dL (ref 8.9–10.3)
CO2: 28 mmol/L (ref 22–32)
CREATININE: 1.02 mg/dL — AB (ref 0.44–1.00)
Chloride: 103 mmol/L (ref 101–111)
GFR, EST NON AFRICAN AMERICAN: 54 mL/min — AB (ref >60)
Glucose, Bld: 141 mg/dL — ABNORMAL HIGH (ref 65–99)
Potassium: 3.6 mmol/L (ref 3.5–5.1)
Sodium: 140 mmol/L (ref 135–145)

## 2014-07-17 LAB — CBC WITH DIFFERENTIAL/PLATELET
BASOS ABS: 0 10*3/uL (ref 0–0.1)
BASOS PCT: 1 %
Eosinophils Absolute: 0 10*3/uL (ref 0–0.7)
Eosinophils Relative: 1 %
HCT: 26 % — ABNORMAL LOW (ref 35.0–47.0)
HEMOGLOBIN: 8.8 g/dL — AB (ref 12.0–16.0)
Lymphocytes Relative: 48 %
Lymphs Abs: 0.9 10*3/uL — ABNORMAL LOW (ref 1.0–3.6)
MCH: 34.5 pg — ABNORMAL HIGH (ref 26.0–34.0)
MCHC: 34 g/dL (ref 32.0–36.0)
MCV: 101.7 fL — ABNORMAL HIGH (ref 80.0–100.0)
Monocytes Absolute: 0.2 10*3/uL (ref 0.2–0.9)
Monocytes Relative: 12 %
NEUTROS ABS: 0.7 10*3/uL — AB (ref 1.4–6.5)
Neutrophils Relative %: 38 %
PLATELETS: 173 10*3/uL (ref 150–440)
RBC: 2.56 MIL/uL — ABNORMAL LOW (ref 3.80–5.20)
RDW: 17.6 % — AB (ref 11.5–14.5)
WBC: 1.8 10*3/uL — AB (ref 3.6–11.0)

## 2014-07-17 LAB — URINALYSIS COMPLETE WITH MICROSCOPIC (ARMC ONLY)
BILIRUBIN URINE: NEGATIVE
GLUCOSE, UA: NEGATIVE mg/dL
Hgb urine dipstick: NEGATIVE
KETONES UR: NEGATIVE mg/dL
Nitrite: NEGATIVE
Protein, ur: NEGATIVE mg/dL
Specific Gravity, Urine: 1.013 (ref 1.005–1.030)
pH: 6 (ref 5.0–8.0)

## 2014-07-17 LAB — TROPONIN I

## 2014-07-17 MED ORDER — DIPHENHYDRAMINE HCL 50 MG/ML IJ SOLN
INTRAMUSCULAR | Status: AC
  Filled 2014-07-17: qty 1

## 2014-07-17 MED ORDER — METHYLPREDNISOLONE SODIUM SUCC 125 MG IJ SOLR
INTRAMUSCULAR | Status: AC
  Filled 2014-07-17: qty 2

## 2014-07-17 NOTE — ED Notes (Signed)
Pt arrives via EMS with near syncope, pt received last treatment for uterine cancer last week, pt was out and about with family and states suddenly "everything went black", pt awake and alert upon arrival

## 2014-07-17 NOTE — ED Provider Notes (Signed)
[facility] Emergency Department Provider Note    ____________________________________________  Time seen: On EMS arrival  I have reviewed the triage vital signs and the nursing notes.   HISTORY  Chief Complaint Near Syncope   History limited by: Not Limited   HPI Frances [name] is a 69 y.o. female who presents to the emergency department today for a near syncopal episode. The patient states she was out at a store with her family when she started feeling weak, felt like her vision was going out and slipped the floor. She does not think that she fully lost consciousness. She did have last received chemotherapy last week. She states today was by far the most active she has been recently. She denies any chest pain, shortness breath, heart racing or skipping beats during this episode. States she feels much better now.   Past Medical History  Diagnosis Date  . Hypertension   . Diabetes mellitus without complication     There are no active problems to display for this patient.   Past Surgical History  Procedure Laterality Date  . Tubal ligation  1980  . Hand surgery Left 2013    Carpel Tunnel   . Cholecystectomy  11-08-12    ERCP with stent(Dr Oh) and  Physiomesh herniation defect repair    Current Outpatient Rx  Name  Route  Sig  Dispense  Refill  . aspirin 81 MG tablet   Oral   Take 81 mg by mouth daily.         . hydrochlorothiazide (HYDRODIURIL) 25 MG tablet   Oral   Take 1 tablet by mouth daily.         Marland Kitchen lisinopril (PRINIVIL,ZESTRIL) 40 MG tablet   Oral   Take 1 tablet by mouth daily.         . meloxicam (MOBIC) 7.5 MG tablet   Oral   Take 1 tablet by mouth daily.         . metFORMIN (GLUCOPHAGE) 500 MG tablet   Oral   Take 1 tablet by mouth daily.         . verapamil (VERELAN PM) 240 MG 24 hr capsule   Oral   Take 1 capsule by mouth daily.           Allergies Review of patient's allergies indicates no known  allergies.  No family history on file.  Social History History  Substance Use Topics  . Smoking status: Never Smoker   . Smokeless tobacco: Never Used  . Alcohol Use: No    Review of Systems  Constitutional: Negative for fever. Cardiovascular: Negative for chest pain. Respiratory: Negative for shortness of breath. Gastrointestinal: Negative for abdominal pain, vomiting and diarrhea. Genitourinary: Negative for dysuria. Musculoskeletal: Negative for back pain. Skin: Negative for rash. Neurological: Negative for headaches, focal weakness or numbness.   10-point ROS otherwise negative.  ____________________________________________   PHYSICAL EXAM:  VITAL SIGNS:  98 F (36.7 C)   59  18  93/63 mmHg  100 %      Constitutional: Alert and oriented. Well appearing and in no distress. Eyes: Conjunctivae are normal. PERRL. Normal extraocular movements. ENT   Head: Normocephalic and atraumatic.   Nose: No congestion/rhinnorhea.   Mouth/Throat: Mucous membranes are moist.   Neck: No stridor. Hematological/Lymphatic/Immunilogical: No cervical lymphadenopathy. Cardiovascular: Normal rate, regular rhythm.  No murmurs, rubs, or gallops. Respiratory: Normal respiratory effort without tachypnea nor retractions. Breath sounds are clear and equal bilaterally. No wheezes/rales/rhonchi. Gastrointestinal: Soft  and nontender. No distention.  Genitourinary: Deferred Musculoskeletal: Normal range of motion in all extremities. No joint effusions.  No lower extremity tenderness nor edema. Neurologic:  Normal speech and language. No gross focal neurologic deficits are appreciated. Speech is normal.  Skin:  Skin is warm, dry and intact. No rash noted. Psychiatric: Mood and affect are normal. Speech and behavior are normal. Patient exhibits appropriate insight and judgment.  ____________________________________________    LABS (pertinent positives/negatives)  Labs Reviewed    CBC WITH DIFFERENTIAL/PLATELET - Abnormal; Notable for the following:    WBC 1.8 (*)    RBC 2.56 (*)    Hemoglobin 8.8 (*)    HCT 26.0 (*)    MCV 101.7 (*)    MCH 34.5 (*)    RDW 17.6 (*)    Neutro Abs 0.7 (*)    Lymphs Abs 0.9 (*)    All other components within normal limits  BASIC METABOLIC PANEL - Abnormal; Notable for the following:    Glucose, Bld 141 (*)    BUN 25 (*)    Creatinine, Ser 1.02 (*)    GFR calc non Af Amer 54 (*)    All other components within normal limits  URINALYSIS COMPLETEWITH MICROSCOPIC ([facility] ONLY) - Abnormal; Notable for the following:    Color, Urine YELLOW (*)    APPearance CLEAR (*)    Leukocytes, UA 2+ (*)    Bacteria, UA RARE (*)    Squamous Epithelial / LPF 0-5 (*)    All other components within normal limits  TROPONIN I        ____________________________________________    RADIOLOGY  None  ____________________________________________   PROCEDURES  Procedure(s) performed: None  Critical Care performed: No  ____________________________________________   INITIAL IMPRESSION / ASSESSMENT AND PLAN / ED COURSE  Pertinent labs & imaging results that were available during my care of the patient were reviewed by me and considered in my medical decision making (see chart for details).  Patient presented to the emergency department after a near syncopal episode. Patient last received chemotherapy 1 week ago. Her blood work does show some depression of her blood cell line. Patient states it is been depressed recently during her chemotherapy. Discussed with patient that she should follow-up with her oncologist primary care doctor. At this point I think there is a couple episode likely due to some decreased by mouth intake and increased activity.   ____________________________________________   FINAL CLINICAL IMPRESSION(S) / ED DIAGNOSES  Final diagnoses:  Near syncope     Nance Pear, MD 07/17/14 2309

## 2014-07-17 NOTE — Discharge Instructions (Signed)
Please seek medical attention for any high fevers, chest pain, shortness of breath, change in behavior, persistent vomiting, bloody stool or any other new or concerning symptoms.  Near-Syncope Near-syncope (commonly known as near fainting) is sudden weakness, dizziness, or feeling like you might pass out. During an episode of near-syncope, you may also develop pale skin, have tunnel vision, or feel sick to your stomach (nauseous). Near-syncope may occur when getting up after sitting or while standing for a long time. It is caused by a sudden decrease in blood flow to the brain. This decrease can result from various causes or triggers, most of which are not serious. However, because near-syncope can sometimes be a sign of something serious, a medical evaluation is required. The specific cause is often not determined. HOME CARE INSTRUCTIONS  Monitor your condition for any changes. The following actions may help to alleviate any discomfort you are experiencing:  Have someone stay with you until you feel stable.  Lie down right away and prop your feet up if you start feeling like you might faint. Breathe deeply and steadily. Wait until all the symptoms have passed. Most of these episodes last only a few minutes. You may feel tired for several hours.   Drink enough fluids to keep your urine clear or pale yellow.   If you are taking blood pressure or heart medicine, get up slowly when seated or lying down. Take several minutes to sit and then stand. This can reduce dizziness.  Follow up with your health care provider as directed. SEEK IMMEDIATE MEDICAL CARE IF:   You have a severe headache.   You have unusual pain in the chest, abdomen, or back.   You are bleeding from the mouth or rectum, or you have black or tarry stool.   You have an irregular or very fast heartbeat.   You have repeated fainting or have seizure-like jerking during an episode.   You faint when sitting or lying down.    You have confusion.   You have difficulty walking.   You have severe weakness.   You have vision problems.  MAKE SURE YOU:   Understand these instructions.  Will watch your condition.  Will get help right away if you are not doing well or get worse. Document Released: 01/06/2005 Document Revised: 01/11/2013 Document Reviewed: 06/11/2012 [facility] Patient Information 2015 [place], [place]. This information is not intended to replace advice given to you by your health care provider. Make sure you discuss any questions you have with your health care provider.

## 2015-05-20 ENCOUNTER — Emergency Department
Admission: EM | Admit: 2015-05-20 | Discharge: 2015-05-20 | Disposition: A | Discharge status: Home / Self Care | Payer: Medicare Other | Attending: Emergency Medicine | Admitting: Emergency Medicine

## 2015-05-20 ENCOUNTER — Encounter: Payer: Self-pay | Admitting: Emergency Medicine

## 2015-05-20 ENCOUNTER — Emergency Department: Payer: Medicare Other

## 2015-05-20 DIAGNOSIS — Z79899 Other long term (current) drug therapy: Secondary | ICD-10-CM | POA: Diagnosis not present

## 2015-05-20 DIAGNOSIS — E119 Type 2 diabetes mellitus without complications: Secondary | ICD-10-CM | POA: Diagnosis not present

## 2015-05-20 DIAGNOSIS — M10072 Idiopathic gout, left ankle and foot: Secondary | ICD-10-CM | POA: Diagnosis not present

## 2015-05-20 DIAGNOSIS — Z7982 Long term (current) use of aspirin: Secondary | ICD-10-CM | POA: Diagnosis not present

## 2015-05-20 DIAGNOSIS — I10 Essential (primary) hypertension: Secondary | ICD-10-CM | POA: Diagnosis not present

## 2015-05-20 DIAGNOSIS — Z7984 Long term (current) use of oral hypoglycemic drugs: Secondary | ICD-10-CM | POA: Insufficient documentation

## 2015-05-20 DIAGNOSIS — M79672 Pain in left foot: Secondary | ICD-10-CM | POA: Diagnosis present

## 2015-05-20 DIAGNOSIS — M109 Gout, unspecified: Secondary | ICD-10-CM

## 2015-05-20 MED ORDER — HYDROCODONE-ACETAMINOPHEN 5-325 MG PO TABS: 1.0000 | ORAL_TABLET | Freq: Four times a day (QID) | ORAL | Status: AC | PRN

## 2015-05-20 MED ORDER — COLCHICINE 0.6 MG PO TABS: ORAL_TABLET | ORAL | Status: AC

## 2015-05-20 NOTE — ED Provider Notes (Signed)
[facility] Emergency Department Provider Note ____________________________________________  Time seen: 1431  I have reviewed the triage vital signs and the nursing notes.  HISTORY  Chief Complaint  Foot Pain  HPI Frances [name] is a 70 y.o. female presents to the ED for evaluation of ongoing pain, and swelling to the left foot. The patient describes pain over the last 3 weeks. She has been seen in the last 3 weeks by [facility] most recently where she was given a prednisone taper pack. She denies any benefit from that medication. She was also seen by her cancer provider prior to that, and he was unsure of the cause and suggested that she refer herself herefor further evaluation. She reports pain with attempts to bear weight and walk on the left foot. She does note however that she was tested by her primary care provider and was notified, 2 days after the visit, that her uric acid was elevated. She reports her pain a 9/10 in triage. She denies any interim injury, trauma, accident, or contusion to the foot or ankle.  Past Medical History  Diagnosis Date  . Hypertension   . Diabetes mellitus without complication ([place])   . Uterine cancer ([place])     There are no active problems to display for this patient.   Past Surgical History  Procedure Laterality Date  . Tubal ligation  1980  . Hand surgery Left 2013    Carpel Tunnel   . Cholecystectomy  11-08-12    ERCP with stent(Dr Oh) and  Physiomesh herniation defect repair    Current Outpatient Rx  Name  Route  Sig  Dispense  Refill  . aspirin 81 MG tablet   Oral   Take 81 mg by mouth daily.         . colchicine 0.6 MG tablet      Take 2 tabs PO x 1, then 1 tab PO 1 hour later x 1  Max: 1.8 mg total dose per attack, do not repeat within 3 days.   10 tablet   0   . hydrochlorothiazide (HYDRODIURIL) 25 MG tablet   Oral   Take 1 tablet by mouth daily.         Marland Kitchen HYDROcodone-acetaminophen (NORCO)  5-325 MG tablet   Oral   Take 1 tablet by mouth every 6 (six) hours as needed for moderate pain.   10 tablet   0   . Iron-FA-B Cmp-C-Biot-Probiotic (FUSION PLUS) CAPS   Oral   Take 1 capsule by mouth every morning.      3   . lisinopril (PRINIVIL,ZESTRIL) 40 MG tablet   Oral   Take 1 tablet by mouth daily.         . meloxicam (MOBIC) 7.5 MG tablet   Oral   Take 1 tablet by mouth daily.         . metFORMIN (GLUCOPHAGE) 500 MG tablet   Oral   Take 1 tablet by mouth 2 (two) times daily with a meal.           Allergies Review of patient's allergies indicates no known allergies.  No family history on file.  Social History Social History  Substance Use Topics  . Smoking status: Never Smoker   . Smokeless tobacco: Never Used  . Alcohol Use: No   Review of Systems  Constitutional: Negative for fever. Musculoskeletal: Negative for back pain. Left foot pain as above. Skin: Negative for rash. Neurological: Negative for headaches, focal weakness or  numbness. ____________________________________________  PHYSICAL EXAM:  VITAL SIGNS: ED Triage Vitals  Enc Vitals Group     BP 05/20/15 1211 104/51 mmHg     Pulse Rate 05/20/15 1211 63     Resp 05/20/15 1211 18     Temp 05/20/15 1211 98.6 F (37 C)     Temp Source 05/20/15 1211 Oral     SpO2 05/20/15 1211 97 %     Weight 05/20/15 1211 196 lb (88.905 kg)     Height 05/20/15 1211 5\' 6"  (1.676 m)     Head Cir --      Peak Flow --      Pain Score 05/20/15 1211 9     Pain Loc --      Pain Edu? --      Excl. in [place]? --    Constitutional: Alert and oriented. Well appearing and in no distress. Head: Normocephalic and atraumatic Cardiovascular: Normal distal pulses and cap refill. Respiratory: Normal respiratory effort.  Musculoskeletal: Left foot with obvious moderate swelling, erythema, and warmth noted globally. She is moderately tender to palpation over the dorsal, lateral, and medial aspects of the foot and ankle.  She is able to demonstrate slow dorsiflexion and plantarflexion limited by pain. achilles tenderness is appreciated. Nontender with normal range of motion in all other extremities.  Neurologic:  Antalgic gait without ataxia. Normal speech and language. No gross focal neurologic deficits are appreciated. Skin:  Skin is warm, dry and intact. No rash noted. Patient's skin is taut but intact to the left foot.  ___________________________________________   RADIOLOGY Left Foot IMPRESSION: No acute findings. No significant degenerative change seen.  I, Alyzabeth Pontillo, Dannielle Karvonen, personally viewed and evaluated these images (plain radiographs) as part of my medical decision making, as well as reviewing the written report by the radiologist. ____________________________________________  PROCEDURES  Post Op Shoe ____________________________________________  INITIAL IMPRESSION / ASSESSMENT AND PLAN / ED COURSE  Patient with an acute gout of the left foot with a history of elevated uric acid. She'll be discharged with a prescription for Colcrys to dose as directed. She is also provided with a prescription for #10 Vicodin to dose as needed for pain. She is fitted with postop shoe for comfort and will follow with the primary care provider for ongoing symptom management. ____________________________________________  FINAL CLINICAL IMPRESSION(S) / ED DIAGNOSES  Final diagnoses:  Acute gout of left foot, unspecified cause      Melvenia Needles, PA-C 05/20/15 1511  Harvest Dark, MD 05/20/15 1539

## 2015-05-20 NOTE — ED Notes (Signed)
Patient presents to the ED with left foot pain x 3 weeks.  Patient states her doctor placed patient on gout medication but this did not improve patient's pain. Patient is in no obvious distress at this time.

## 2015-05-20 NOTE — Discharge Instructions (Signed)
Gout °Gout is an inflammatory arthritis caused by a buildup of uric acid crystals in the joints. Uric acid is a chemical that is normally present in the blood. When the level of uric acid in the blood is too high it can form crystals that deposit in your joints and tissues. This causes joint redness, soreness, and swelling (inflammation). Repeat attacks are common. Over time, uric acid crystals can form into masses (tophi) near a joint, destroying bone and causing disfigurement. Gout is treatable and often preventable. °CAUSES  °The disease begins with elevated levels of uric acid in the blood. Uric acid is produced by your body when it breaks down a naturally found substance called purines. Certain foods you eat, such as meats and fish, contain high amounts of purines. Causes of an elevated uric acid level include: °· Being passed down from parent to child (heredity). °· Diseases that cause increased uric acid production (such as obesity, psoriasis, and certain cancers). °· Excessive alcohol use. °· Diet, especially diets rich in meat and seafood. °· Medicines, including certain cancer-fighting medicines (chemotherapy), water pills (diuretics), and aspirin. °· Chronic kidney disease. The kidneys are no longer able to remove uric acid well. °· Problems with metabolism. °Conditions strongly associated with gout include: °· Obesity. °· High blood pressure. °· High cholesterol. °· Diabetes. °Not everyone with elevated uric acid levels gets gout. It is not understood why some people get gout and others do not. Surgery, joint injury, and eating too much of certain foods are some of the factors that can lead to gout attacks. °SYMPTOMS  °· An attack of gout comes on quickly. It causes intense pain with redness, swelling, and warmth in a joint. °· Fever can occur. °· Often, only one joint is involved. Certain joints are more commonly involved: °· Base of the big toe. °· Knee. °· Ankle. °· Wrist. °· Finger. °Without  treatment, an attack usually goes away in a few days to weeks. Between attacks, you usually will not have symptoms, which is different from many other forms of arthritis. °DIAGNOSIS  °Your caregiver will suspect gout based on your symptoms and exam. In some cases, tests may be recommended. The tests may include: °· Blood tests. °· Urine tests. °· X-rays. °· Joint fluid exam. This exam requires a needle to remove fluid from the joint (arthrocentesis). Using a microscope, gout is confirmed when uric acid crystals are seen in the joint fluid. °TREATMENT  °There are two phases to gout treatment: treating the sudden onset (acute) attack and preventing attacks (prophylaxis). °· Treatment of an Acute Attack. °· Medicines are used. These include anti-inflammatory medicines or steroid medicines. °· An injection of steroid medicine into the affected joint is sometimes necessary. °· The painful joint is rested. Movement can worsen the arthritis. °· You may use warm or cold treatments on painful joints, depending which works best for you. °· Treatment to Prevent Attacks. °· If you suffer from frequent gout attacks, your caregiver may advise preventive medicine. These medicines are started after the acute attack subsides. These medicines either help your kidneys eliminate uric acid from your body or decrease your uric acid production. You may need to stay on these medicines for a very long time. °· The early phase of treatment with preventive medicine can be associated with an increase in acute gout attacks. For this reason, during the first few months of treatment, your caregiver may also advise you to take medicines usually used for acute gout treatment. Be sure you   understand your caregiver's directions. Your caregiver may make several adjustments to your medicine dose before these medicines are effective.  Discuss dietary treatment with your caregiver or dietitian. Alcohol and drinks high in sugar and fructose and foods  such as meat, poultry, and seafood can increase uric acid levels. Your caregiver or dietitian can advise you on drinks and foods that should be limited. HOME CARE INSTRUCTIONS   Do not take aspirin to relieve pain. This raises uric acid levels.  Only take over-the-counter or prescription medicines for pain, discomfort, or fever as directed by your caregiver.  Rest the joint as much as possible. When in bed, keep sheets and blankets off painful areas.  Keep the affected joint raised (elevated).  Apply warm or cold treatments to painful joints. Use of warm or cold treatments depends on which works best for you.  Use crutches if the painful joint is in your leg.  Drink enough fluids to keep your urine clear or pale yellow. This helps your body get rid of uric acid. Limit alcohol, sugary drinks, and fructose drinks.  Follow your dietary instructions. Pay careful attention to the amount of protein you eat. Your daily diet should emphasize fruits, vegetables, whole grains, and fat-free or low-fat milk products. Discuss the use of coffee, vitamin C, and cherries with your caregiver or dietitian. These may be helpful in lowering uric acid levels.  Maintain a healthy body weight. SEEK MEDICAL CARE IF:   You develop diarrhea, vomiting, or any side effects from medicines.  You do not feel better in 24 hours, or you are getting worse. SEEK IMMEDIATE MEDICAL CARE IF:   Your joint becomes suddenly more tender, and you have chills or a fever. MAKE SURE YOU:   Understand these instructions.  Will watch your condition.  Will get help right away if you are not doing well or get worse.   This information is not intended to replace advice given to you by your health care provider. Make sure you discuss any questions you have with your health care provider.   Document Released: 01/04/2000 Document Revised: 01/27/2014 Document Reviewed: 08/20/2011 Elsevier Interactive Patient Education 2016  [place] are compounds that affect the level of uric acid in your body. A low-purine diet is a diet that is low in purines. Eating a low-purine diet can prevent the level of uric acid in your body from getting too high and causing gout or kidney stones or both. WHAT DO I NEED TO KNOW ABOUT THIS DIET?  Choose low-purine foods. Examples of low-purine foods are listed in the next section.  Drink plenty of fluids, especially water. Fluids can help remove uric acid from your body. Try to drink 8-16 cups (1.9-3.8 L) a day.  Limit foods high in fat, especially saturated fat, as fat makes it harder for the body to get rid of uric acid. Foods high in saturated fat include pizza, cheese, ice cream, whole milk, fried foods, and gravies. Choose foods that are lower in fat and lean sources of protein. Use olive oil when cooking as it contains healthy fats that are not high in saturated fat.  Limit alcohol. Alcohol interferes with the elimination of uric acid from your body. If you are having a gout attack, avoid all alcohol.  Keep in mind that different people's bodies react differently to different foods. You will probably learn over time which foods do or do not affect you. If you discover that a food tends to  cause your gout to flare up, avoid eating that food. You can more freely enjoy foods that do not cause problems. If you have any questions about a food item, talk to your dietitian or health care provider. WHICH FOODS ARE LOW, MODERATE, AND HIGH IN PURINES? The following is a list of foods that are low, moderate, and high in purines. You can eat any amount of the foods that are low in purines. You may be able to have small amounts of foods that are moderate in purines. Ask your health care provider how much of a food moderate in purines you can have. Avoid foods high in purines. Grains  Foods low in purines: Enriched white bread, pasta, rice, cake, cornbread,  popcorn.  Foods moderate in purines: Whole-grain breads and cereals, wheat germ, bran, oatmeal. Uncooked oatmeal. Dry wheat bran or wheat germ.  Foods high in purines: Pancakes, [place] toast, biscuits, muffins. Vegetables  Foods low in purines: All vegetables, except those that are moderate in purines.  Foods moderate in purines: Asparagus, cauliflower, spinach, mushrooms, green peas. Fruits  All fruits are low in purines. Meats and other Protein Foods  Foods low in purines: Eggs, nuts, peanut butter.  Foods moderate in purines: 80-90% lean beef, lamb, veal, pork, poultry, fish, eggs, peanut butter, nuts. Crab, lobster, oysters, and shrimp. Cooked dried beans, peas, and lentils.  Foods high in purines: Anchovies, sardines, herring, mussels, tuna, codfish, scallops, trout, and haddock. Berniece Salines. Organ meats (such as liver or kidney). [place]. Game meat. Goose. Sweetbreads. Dairy  All dairy foods are low in purines. Low-fat and fat-free dairy products are best because they are low in saturated fat. Beverages  Drinks low in purines: Water, carbonated beverages, tea, coffee, cocoa.  Drinks moderate in purines: Soft drinks and other drinks sweetened with high-fructose corn syrup. Juices. To find whether a food or drink is sweetened with high-fructose corn syrup, look at the ingredients list.  Drinks high in purines: Alcoholic beverages (such as beer). Condiments  Foods low in purines: Salt, herbs, olives, pickles, relishes, vinegar.  Foods moderate in purines: Butter, margarine, oils, mayonnaise. Fats and Oils  Foods low in purines: All types, except gravies and sauces made with meat.  Foods high in purines: Gravies and sauces made with meat. Other Foods  Foods low in purines: Sugars, sweets, gelatin. Cake. Soups made without meat.  Foods moderate in purines: Meat-based or fish-based soups, broths, or bouillons. Foods and drinks sweetened with high-fructose corn syrup.  Foods high  in purines: High-fat desserts (such as ice cream, cookies, cakes, pies, doughnuts, and chocolate). Contact your dietitian for more information on foods that are not listed here.   This information is not intended to replace advice given to you by your health care provider. Make sure you discuss any questions you have with your health care provider.   Document Released: 05/03/2010 Document Revised: 01/11/2013 Document Reviewed: 12/13/2012 Elsevier Interactive Patient Education 2016 [place] the prescription meds as directed. Follow-up with your provider as needed.

## 2015-05-20 NOTE — ED Notes (Addendum)
Patient c/o left foot pain for 3 weeks. Patient states that she has been seen twice for this, once at [facility] clinic where she was given a prednisone taper and once by her cancer doctor who told her he wasn't sure what was wrong and for her to com here and let us evaluate her.  Patient states that she is unable to bear any weight on her foot.

## 2015-11-05 ENCOUNTER — Other Ambulatory Visit: Payer: Self-pay | Admitting: Neurology

## 2015-11-05 DIAGNOSIS — M542 Cervicalgia: Secondary | ICD-10-CM

## 2015-11-15 ENCOUNTER — Ambulatory Visit
Admission: RE | Admit: 2015-11-15 | Discharge: 2015-11-15 | Disposition: A | Discharge status: Home / Self Care | Payer: Medicare Other | Source: Ambulatory Visit | Attending: Neurology | Admitting: Neurology

## 2015-11-15 DIAGNOSIS — M542 Cervicalgia: Secondary | ICD-10-CM

## 2015-11-15 DIAGNOSIS — M4802 Spinal stenosis, cervical region: Secondary | ICD-10-CM | POA: Diagnosis not present

## 2015-11-15 DIAGNOSIS — E049 Nontoxic goiter, unspecified: Secondary | ICD-10-CM | POA: Diagnosis not present

## 2015-11-15 DIAGNOSIS — M2578 Osteophyte, vertebrae: Secondary | ICD-10-CM | POA: Diagnosis not present

## 2015-11-20 ENCOUNTER — Ambulatory Visit: Payer: Medicare Other

## 2016-01-18 ENCOUNTER — Emergency Department: Payer: Medicare Other

## 2016-01-18 ENCOUNTER — Encounter: Payer: Self-pay | Admitting: Emergency Medicine

## 2016-01-18 ENCOUNTER — Emergency Department
Admission: EM | Admit: 2016-01-18 | Discharge: 2016-01-18 | Disposition: A | Discharge status: Home / Self Care | Payer: Medicare Other | Attending: Emergency Medicine | Admitting: Emergency Medicine

## 2016-01-18 DIAGNOSIS — Z7984 Long term (current) use of oral hypoglycemic drugs: Secondary | ICD-10-CM | POA: Diagnosis not present

## 2016-01-18 DIAGNOSIS — E119 Type 2 diabetes mellitus without complications: Secondary | ICD-10-CM | POA: Insufficient documentation

## 2016-01-18 DIAGNOSIS — J069 Acute upper respiratory infection, unspecified: Secondary | ICD-10-CM | POA: Diagnosis not present

## 2016-01-18 DIAGNOSIS — Z7982 Long term (current) use of aspirin: Secondary | ICD-10-CM | POA: Diagnosis not present

## 2016-01-18 DIAGNOSIS — R05 Cough: Secondary | ICD-10-CM | POA: Diagnosis present

## 2016-01-18 DIAGNOSIS — I10 Essential (primary) hypertension: Secondary | ICD-10-CM | POA: Diagnosis not present

## 2016-01-18 DIAGNOSIS — Z8541 Personal history of malignant neoplasm of cervix uteri: Secondary | ICD-10-CM | POA: Diagnosis not present

## 2016-01-18 DIAGNOSIS — Z79899 Other long term (current) drug therapy: Secondary | ICD-10-CM | POA: Diagnosis not present

## 2016-01-18 MED ORDER — IPRATROPIUM-ALBUTEROL 0.5-2.5 (3) MG/3ML IN SOLN
3.0000 mL | Freq: Once | RESPIRATORY_TRACT | Status: DC
  Filled 2016-01-18: qty 3

## 2016-01-18 NOTE — ED Notes (Signed)
See triage note  Low grade fever with body aches and sore throat for 2-3 days .Marland Kitchen

## 2016-01-18 NOTE — ED Provider Notes (Signed)
[facility] Emergency Department Provider Note  ____________________________________________  Time seen: Approximately 11:26 AM  I have reviewed the triage vital signs and the nursing notes.   HISTORY  Chief Complaint Generalized Body Aches; Sore Throat; and Otalgia    HPI Frances [name] is a 70 y.o. female with a history of uterine cancer status post chemotherapy and radiation (treated at [facility] - last treatment 11/2014 per patient) presents with three days of acute nonproductive cough worse at night, congestion, pharyngitis, frontal headache, fatigue and bilateral earache. She does not have a history of chronic otitis media. 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 Patient denies chest pain or shortness of breath, nausea, vomiting, diarrhea, constipation, hematuria and dysuria. Denies night sweats and bony pain. She states that she was seen in urgent care yesterday. Rapid influenza testing was negative at aforementioned visit. No recent travel. Has been afebrile. Patient has tried Robitussin cough to relieve cough symptoms.   Past Medical History:  Diagnosis Date  . Diabetes mellitus without complication ([place])   . Hypertension   . Uterine cancer ([place])     There are no active problems to display for this patient.   Past Surgical History:  Procedure Laterality Date  . CHOLECYSTECTOMY  11-08-12   ERCP with stent(Dr Oh) and  Physiomesh herniation defect repair  . HAND SURGERY Left 2013   Carpel Tunnel   . TUBAL LIGATION  1980    Prior to Admission medications   Medication Sig Start Date End Date Taking? Authorizing Provider  aspirin 81 MG tablet Take 81 mg by mouth daily.    Historical Provider, MD  colchicine 0.6 MG tablet Take 2 tabs PO x 1, then 1 tab PO 1 hour later x 1  Max: 1.8 mg total dose per attack, do not repeat within 3 days. 05/20/15   Jenise V Bacon Menshew, PA-C  hydrochlorothiazide (HYDRODIURIL) 25 MG tablet Take 1  tablet by mouth daily. 08/19/12   Historical Provider, MD  HYDROcodone-acetaminophen (NORCO) 5-325 MG tablet Take 1 tablet by mouth every 6 (six) hours as needed for moderate pain. 05/20/15   Jenise V Bacon Menshew, PA-C  Iron-FA-B Cmp-C-Biot-Probiotic (FUSION PLUS) CAPS Take 1 capsule by mouth every morning. 06/09/14   Historical Provider, MD  lisinopril (PRINIVIL,ZESTRIL) 40 MG tablet Take 1 tablet by mouth daily. 09/05/12   Historical Provider, MD  meloxicam (MOBIC) 7.5 MG tablet Take 1 tablet by mouth daily. 10/30/12   Historical Provider, MD  metFORMIN (GLUCOPHAGE) 500 MG tablet Take 1 tablet by mouth 2 (two) times daily with a meal.  09/04/12   Historical Provider, MD    Allergies Patient has no known allergies.  History reviewed. No pertinent family history.  Social History Social History  Substance Use Topics  . Smoking status: Never Smoker  . Smokeless tobacco: Never Used  . Alcohol use No     Review of Systems  Constitutional: No fever/chills Eyes: No visual changes. No discharge ENT: Cough, congestion, headache, ear pain Cardiovascular: no chest pain. Respiratory: no cough. No SOB. Gastrointestinal: No abdominal pain.  No nausea, no vomiting.  No diarrhea.  No constipation. Genitourinary: Negative for dysuria. No hematuria Musculoskeletal: Has myalgias Skin: Negative for rash, abrasions, lacerations, ecchymosis. Neurological: Has headache, focal weakness or numbness. 10-point ROS otherwise negative.  ____________________________________________   PHYSICAL EXAM:  VITAL SIGNS: ED Triage Vitals [01/18/16 1024]  Enc Vitals Group     BP (!) 106/55     Pulse Rate 65  Resp 18     Temp 98.9 F (37.2 C)     Temp Source Oral     SpO2 100 %     Weight 198 lb (89.8 kg)     Height 5\' 7"  (1.702 m)     Head Circumference      Peak Flow      Pain Score 8     Pain Loc      Pain Edu?      Excl. in [place]?      Constitutional: Lying supine in bed. Eyes: Conjunctivae  are normal. PERRL. EOMI. Head: Atraumatic. ENT:      Ears: Tympanic membranes are pearly bilaterally without erythema or purulent exudate. No middle ear effusion bilaterally.      Nose: Nasal turbinates are edematous.      Mouth/Throat: Posterior pharynx is erythematous without tonsillar hypertrophy or exudate. Uvula is midline. Neck: Full range of motion. No pain is elicited with neck flexion. Hematological/Lymphatic/Immunilogical: No cervical lymphadenopathy. Cardiovascular: Normal rate, regular rhythm. Normal S1 and S2.  Good peripheral circulation. Respiratory: Normal respiratory effort without tachypnea or retractions. Lungs CTAB. Good air entry to the bases with no decreased or absent breath sounds. Gastrointestinal: Bowel sounds 4 quadrants. Soft and nontender to palpation. No guarding or rigidity. No palpable masses. No distention. No CVA tenderness. Neurologic:  Normal speech and language. No gross focal neurologic deficits are appreciated.  Skin:  Skin is warm, dry and intact. No rash noted. Psychiatric: Mood and affect are normal. Speech and behavior are normal. Patient exhibits appropriate insight and judgement.   ____________________________________________   LABS (all labs ordered are listed, but only abnormal results are displayed)  Labs Reviewed - No data to display ____________________________________________  EKG   ____________________________________________  RADIOLOGY Unk Pinto, personally viewed and evaluated these images (plain radiographs) as part of my medical decision making, as well as reviewing the written report by the radiologist.  Dg Chest 2 View  Result Date: 01/18/2016 CLINICAL DATA:  Shortness of breath.  History of uterine carcinoma EXAM: CHEST  2 VIEW COMPARISON:  November 11, 2012 FINDINGS: There is no edema or consolidation. Heart size and pulmonary vascularity are normal. No adenopathy. There is atherosclerotic calcification in the  aorta. There is degenerative change in the thoracic spine. IMPRESSION: No edema or consolidation.  Aortic atherosclerosis. Electronically Signed   By: Lowella Grip III M.D.   On: 01/18/2016 10:28    ____________________________________________    PROCEDURES  Procedure(s) performed:    Procedures    Medications - No data to display   ____________________________________________   INITIAL IMPRESSION / ASSESSMENT AND PLAN / ED COURSE  Pertinent labs & imaging results that were available during my care of the patient were reviewed by me and considered in my medical decision making (see chart for details).  Review of the [place] CSRS was performed in accordance of the [place] prior to dispensing any controlled drugs.  Clinical Course     Assessment and plan: Patient presents with 3 days of acute nonproductive cough, headache, malaise and bilateral ear pain. Physical exam findings do not reveal evidence of otitis media. DG chest revealed no consolidations or findings consistent with pneumonia. Viral upper respiratory tract infection is likely. Patient was advised to rest and stay hydrated. Patient states that her current cough suppression regimen is effective for her. No new medications were prescribed. All patient questions were answered. Vital signs are reassuring at this time.  ____________________________________________  FINAL CLINICAL IMPRESSION(S) /  ED DIAGNOSES  Final diagnoses:  Viral upper respiratory tract infection      NEW MEDICATIONS STARTED DURING THIS VISIT:  New Prescriptions   No medications on file    This chart was dictated using voice recognition software/Dragon. Despite best efforts to proofread, errors can occur which can change the meaning. Any change was purely unintentional.    Lannie Fields, PA-C 01/18/16 1142    Lavonia Drafts, MD 01/18/16 1401

## 2016-01-18 NOTE — ED Triage Notes (Signed)
Pt reports flu-like sxs started 12/26 pt c/o generalized body aches, sore throat and ear aches worse on the left. Pt states she has been running low grade fevers around 99.  Pt has been using OTC products for sxs with minimal improvement.

## 2016-04-03 ENCOUNTER — Other Ambulatory Visit: Payer: Self-pay | Admitting: Internal Medicine

## 2016-04-03 DIAGNOSIS — Z1231 Encounter for screening mammogram for malignant neoplasm of breast: Secondary | ICD-10-CM

## 2016-05-01 ENCOUNTER — Ambulatory Visit
Admission: RE | Admit: 2016-05-01 | Discharge: 2016-05-01 | Disposition: A | Discharge status: Home / Self Care | Payer: Medicare Other | Source: Ambulatory Visit | Attending: Internal Medicine | Admitting: Internal Medicine

## 2016-05-01 DIAGNOSIS — Z1231 Encounter for screening mammogram for malignant neoplasm of breast: Secondary | ICD-10-CM | POA: Insufficient documentation

## 2016-05-09 ENCOUNTER — Other Ambulatory Visit: Payer: Self-pay | Admitting: *Deleted

## 2016-05-09 ENCOUNTER — Inpatient Hospital Stay
Admission: RE | Admit: 2016-05-09 | Discharge: 2016-05-09 | Disposition: A | Discharge status: Home / Self Care | Payer: Self-pay | Source: Ambulatory Visit | Attending: *Deleted | Admitting: *Deleted

## 2016-05-09 DIAGNOSIS — Z9289 Personal history of other medical treatment: Secondary | ICD-10-CM

## 2016-05-28 ENCOUNTER — Other Ambulatory Visit: Payer: Self-pay | Admitting: Internal Medicine

## 2016-05-28 DIAGNOSIS — R109 Unspecified abdominal pain: Secondary | ICD-10-CM

## 2016-05-28 DIAGNOSIS — N23 Unspecified renal colic: Secondary | ICD-10-CM

## 2016-05-29 ENCOUNTER — Emergency Department: Payer: Medicare Other

## 2016-05-29 ENCOUNTER — Emergency Department
Admission: EM | Admit: 2016-05-29 | Discharge: 2016-05-30 | Disposition: A | Discharge status: Home / Self Care | Payer: Medicare Other | Attending: Emergency Medicine | Admitting: Emergency Medicine

## 2016-05-29 ENCOUNTER — Encounter: Payer: Self-pay | Admitting: Radiology

## 2016-05-29 DIAGNOSIS — E119 Type 2 diabetes mellitus without complications: Secondary | ICD-10-CM | POA: Insufficient documentation

## 2016-05-29 DIAGNOSIS — N390 Urinary tract infection, site not specified: Secondary | ICD-10-CM | POA: Diagnosis not present

## 2016-05-29 DIAGNOSIS — Z7982 Long term (current) use of aspirin: Secondary | ICD-10-CM | POA: Diagnosis not present

## 2016-05-29 DIAGNOSIS — R599 Enlarged lymph nodes, unspecified: Secondary | ICD-10-CM | POA: Diagnosis not present

## 2016-05-29 DIAGNOSIS — Z7984 Long term (current) use of oral hypoglycemic drugs: Secondary | ICD-10-CM | POA: Diagnosis not present

## 2016-05-29 DIAGNOSIS — I10 Essential (primary) hypertension: Secondary | ICD-10-CM | POA: Insufficient documentation

## 2016-05-29 DIAGNOSIS — Z79899 Other long term (current) drug therapy: Secondary | ICD-10-CM | POA: Insufficient documentation

## 2016-05-29 DIAGNOSIS — R109 Unspecified abdominal pain: Secondary | ICD-10-CM | POA: Diagnosis present

## 2016-05-29 LAB — URINALYSIS, COMPLETE (UACMP) WITH MICROSCOPIC
Bacteria, UA: NONE SEEN
Bilirubin Urine: NEGATIVE
GLUCOSE, UA: NEGATIVE mg/dL
Hgb urine dipstick: NEGATIVE
KETONES UR: NEGATIVE mg/dL
Nitrite: NEGATIVE
PH: 5 (ref 5.0–8.0)
Protein, ur: NEGATIVE mg/dL
SPECIFIC GRAVITY, URINE: 1.019 (ref 1.005–1.030)

## 2016-05-29 LAB — COMPREHENSIVE METABOLIC PANEL
ALT: 17 U/L (ref 14–54)
AST: 28 U/L (ref 15–41)
Albumin: 3.9 g/dL (ref 3.5–5.0)
Alkaline Phosphatase: 79 U/L (ref 38–126)
Anion gap: 7 (ref 5–15)
BILIRUBIN TOTAL: 0.5 mg/dL (ref 0.3–1.2)
BUN: 18 mg/dL (ref 6–20)
CALCIUM: 9.2 mg/dL (ref 8.9–10.3)
CHLORIDE: 105 mmol/L (ref 101–111)
CO2: 27 mmol/L (ref 22–32)
CREATININE: 1.03 mg/dL — AB (ref 0.44–1.00)
GFR, EST NON AFRICAN AMERICAN: 53 mL/min — AB (ref >60)
Glucose, Bld: 162 mg/dL — ABNORMAL HIGH (ref 65–99)
Potassium: 3.9 mmol/L (ref 3.5–5.1)
Sodium: 139 mmol/L (ref 135–145)
TOTAL PROTEIN: 7 g/dL (ref 6.5–8.1)

## 2016-05-29 LAB — CBC
HCT: 34.7 % — ABNORMAL LOW (ref 35.0–47.0)
Hemoglobin: 11.9 g/dL — ABNORMAL LOW (ref 12.0–16.0)
MCH: 33.2 pg (ref 26.0–34.0)
MCHC: 34.2 g/dL (ref 32.0–36.0)
MCV: 97.2 fL (ref 80.0–100.0)
PLATELETS: 163 10*3/uL (ref 150–440)
RBC: 3.57 MIL/uL — AB (ref 3.80–5.20)
RDW: 13.5 % (ref 11.5–14.5)
WBC: 5.8 10*3/uL (ref 3.6–11.0)

## 2016-05-29 LAB — LIPASE, BLOOD: LIPASE: 22 U/L (ref 11–51)

## 2016-05-29 MED ORDER — CEFTRIAXONE SODIUM IN DEXTROSE 20 MG/ML IV SOLN
1.0000 g | Freq: Once | INTRAVENOUS | Status: AC
  Administered 2016-05-29: 1 g via INTRAVENOUS
  Filled 2016-05-29: qty 50

## 2016-05-29 MED ORDER — CEPHALEXIN 500 MG PO CAPS: 500.0000 mg | ORAL_CAPSULE | Freq: Three times a day (TID) | ORAL | 0 refills | Status: AC

## 2016-05-29 MED ORDER — IOPAMIDOL (ISOVUE-300) INJECTION 61%
100.0000 mL | Freq: Once | INTRAVENOUS | Status: AC | PRN
  Administered 2016-05-29: 100 mL via INTRAVENOUS

## 2016-05-29 MED ORDER — IOPAMIDOL (ISOVUE-300) INJECTION 61%
30.0000 mL | Freq: Once | INTRAVENOUS | Status: AC
  Administered 2016-05-29: 30 mL via ORAL

## 2016-05-29 NOTE — Discharge Instructions (Signed)
As discussed please talk to your primary care doctor about getting repeat imaging of your abdomen to evaluate the lymph node that was seen today. Please seek medical attention for any high fevers, chest pain, shortness of breath, change in behavior, persistent vomiting, bloody stool or any other new or concerning symptoms.

## 2016-05-29 NOTE — ED Notes (Signed)
Patient transported to CT 

## 2016-05-29 NOTE — ED Provider Notes (Signed)
[facility] Emergency Department Provider Note   ____________________________________________   I have reviewed the triage vital signs and the nursing notes.   HISTORY  Chief Complaint Abdominal Pain   History limited by: Not Limited   HPI Frances [name] is a 71 y.o. female who presents to the emergency department today because of concerns for abdominal pain. Patient states this been going on for roughly the past 5 days. She states the abdominal pain initially was more on the right side of her main however has since moved over to the left side. She did see her doctor on Tuesday. She states that additionally the doctor had ordered an ultrasound for the following week. It is unclear what the doctor was planning on ultrasound. She is status post cholecystectomy. The patient has had nausea with this as well as some diarrhea. Patient denies any fevers.   Past Medical History:  Diagnosis Date  . Diabetes mellitus without complication ([place])   . Hypertension   . Uterine cancer ([place])     There are no active problems to display for this patient.   Past Surgical History:  Procedure Laterality Date  . CHOLECYSTECTOMY  11-08-12   ERCP with stent(Dr Oh) and  Physiomesh herniation defect repair  . HAND SURGERY Left 2013   Carpel Tunnel   . TUBAL LIGATION  1980    Prior to Admission medications   Medication Sig Start Date End Date Taking? Authorizing Provider  aspirin 81 MG tablet Take 81 mg by mouth daily.    [provider]  colchicine 0.6 MG tablet Take 2 tabs PO x 1, then 1 tab PO 1 hour later x 1  Max: 1.8 mg total dose per attack, do not repeat within 3 days. 05/20/15   Menshew, Dannielle Karvonen, PA-C  hydrochlorothiazide (HYDRODIURIL) 25 MG tablet Take 1 tablet by mouth daily. 08/19/12   [provider]  HYDROcodone-acetaminophen (NORCO) 5-325 MG tablet Take 1 tablet by mouth every 6 (six) hours as needed for moderate pain. 05/20/15    Menshew, Dannielle Karvonen, PA-C  Iron-FA-B Cmp-C-Biot-Probiotic (FUSION PLUS) CAPS Take 1 capsule by mouth every morning. 06/09/14   [provider]  lisinopril (PRINIVIL,ZESTRIL) 40 MG tablet Take 1 tablet by mouth daily. 09/05/12   [provider]  meloxicam (MOBIC) 7.5 MG tablet Take 1 tablet by mouth daily. 10/30/12   [provider]  metFORMIN (GLUCOPHAGE) 500 MG tablet Take 1 tablet by mouth 2 (two) times daily with a meal.  09/04/12   [provider]    Allergies Patient has no known allergies.  Family History  Problem Relation Age of Onset  . Breast cancer Mother 43  . Breast cancer Sister        early 30's-twice    Social History Social History  Substance Use Topics  . Smoking status: Never Smoker  . Smokeless tobacco: Never Used  . Alcohol use No    Review of Systems Constitutional: No fever/chills Eyes: No visual changes. ENT: No sore throat. Cardiovascular: Denies chest pain. Respiratory: Denies shortness of breath. Gastrointestinal: Positive for abdominal pain. Genitourinary: Negative for dysuria. Musculoskeletal: Negative for back pain. Skin: Negative for rash. Neurological: Negative for headaches, focal weakness or numbness.  ____________________________________________   PHYSICAL EXAM:  VITAL SIGNS: ED Triage Vitals  Enc Vitals Group     BP 05/29/16 2036 (!) 147/115     Pulse Rate 05/29/16 2036 70     Resp 05/29/16 2036 18  Temp 05/29/16 2036 99.3 F (37.4 C)     Temp Source 05/29/16 2036 Oral     SpO2 05/29/16 2036 99 %     Weight 05/29/16 2037 195 lb (88.5 kg)     Height 05/29/16 2037 5\' 9"  (1.753 m)     Head Circumference --      Peak Flow --      Pain Score 05/29/16 2036 9   Constitutional: Alert and oriented. Well appearing and in no distress. Eyes: Conjunctivae are normal. Normal extraocular movements. ENT   Head: Normocephalic and atraumatic.   Nose: No congestion/rhinnorhea.    Mouth/Throat: Mucous membranes are moist.   Neck: No stridor. Hematological/Lymphatic/Immunilogical: No cervical lymphadenopathy. Cardiovascular: Normal rate, regular rhythm.  No murmurs, rubs, or gallops.  Respiratory: Normal respiratory effort without tachypnea nor retractions. Breath sounds are clear and equal bilaterally. No wheezes/rales/rhonchi. Gastrointestinal: Soft and non tender. No rebound. No guarding.  Genitourinary: Deferred Musculoskeletal: Normal range of motion in all extremities. No lower extremity edema. Neurologic:  Normal speech and language. No gross focal neurologic deficits are appreciated.  Skin:  Skin is warm, dry and intact. No rash noted. Psychiatric: Mood and affect are normal. Speech and behavior are normal. Patient exhibits appropriate insight and judgment.  ____________________________________________    LABS (pertinent positives/negatives)  Labs Reviewed  CBC - Abnormal; Notable for the following:       Result Value   RBC 3.57 (*)    Hemoglobin 11.9 (*)    HCT 34.7 (*)    All other components within normal limits  COMPREHENSIVE METABOLIC PANEL - Abnormal; Notable for the following:    Glucose, Bld 162 (*)    Creatinine, Ser 1.03 (*)    GFR calc non Af Amer 53 (*)    All other components within normal limits  URINALYSIS, COMPLETE (UACMP) WITH MICROSCOPIC - Abnormal; Notable for the following:    Color, Urine YELLOW (*)    APPearance HAZY (*)    Leukocytes, UA LARGE (*)    Squamous Epithelial / LPF 0-5 (*)    All other components within normal limits  URINE CULTURE  LIPASE, BLOOD     ____________________________________________   EKG  None  ____________________________________________    RADIOLOGY  CT abd/pel IMPRESSION:  There is inflammatory or infiltrative retroperitoneal mass in the  anterior periaortic region below the fourth portion of the duodenum.  This is most likely to represent enlarged lymph node. Differential   diagnosis would include lymphoma or infected lymph node. Metastasis  or primary soft tissue malignancy would be felt less likely due to  the appearance.       ____________________________________________   PROCEDURES  Procedures  ____________________________________________   INITIAL IMPRESSION / ASSESSMENT AND PLAN / ED COURSE  Pertinent labs & imaging results that were available during my care of the patient were reviewed by me and considered in my medical decision making (see chart for details).  Patient presented to the emergency department today because of concerns for abdominal pain. Urine was concerning for urinary tract infection. CT scan was obtained given concern for possible intra-abdominal infection. CT scan did show a possible infected lymph node or enlarged lymph node. This point some unclear clinical significance. I did discussion with the patient. I do think it could be reactive secondary to urinary tract infection. Patient's blood work and vital signs without any overly concerning findings. Will trial patient on antibiotics for urinary tract infection. Did discuss with patient importance of following up with primary care  for repeat imaging given concern for possible cancer. Patient felt comfortable with the plan. We did discuss return precautions.  ____________________________________________   FINAL CLINICAL IMPRESSION(S) / ED DIAGNOSES  Final diagnoses:  Lower urinary tract infectious disease  Enlarged lymph node     Note: This dictation was prepared with Dragon dictation. Any transcriptional errors that result from this process are unintentional     Nance Pear, MD 05/29/16 2357

## 2016-05-29 NOTE — ED Triage Notes (Signed)
Pt states since Monday has had generalized abd pain since Monday. States becomes nauseous after she eats and then has diarrhea. Saw pmd Tuesday and was put on antibiotics for "inflammation". Went back today for persistent symptoms and was scheduled for abd [place] Monday, but states pmd suggested to come here.

## 2016-05-31 LAB — URINE CULTURE: CULTURE: NO GROWTH

## 2016-06-02 ENCOUNTER — Ambulatory Visit: Payer: Medicare Other

## 2016-08-08 ENCOUNTER — Other Ambulatory Visit: Payer: Self-pay | Admitting: Internal Medicine

## 2016-08-08 DIAGNOSIS — R130 Aphagia: Secondary | ICD-10-CM

## 2016-08-18 ENCOUNTER — Other Ambulatory Visit: Payer: Medicare Other

## 2016-08-18 ENCOUNTER — Ambulatory Visit: Admission: RE | Admit: 2016-08-18 | Payer: Medicare Other | Source: Ambulatory Visit

## 2016-11-20 ENCOUNTER — Emergency Department
Admission: EM | Admit: 2016-11-20 | Discharge: 2016-11-20 | Disposition: A | Discharge status: Home / Self Care | Payer: Medicare Other | Attending: Emergency Medicine | Admitting: Emergency Medicine

## 2016-11-20 ENCOUNTER — Emergency Department: Payer: Medicare Other

## 2016-11-20 DIAGNOSIS — R590 Localized enlarged lymph nodes: Secondary | ICD-10-CM

## 2016-11-20 DIAGNOSIS — R599 Enlarged lymph nodes, unspecified: Secondary | ICD-10-CM | POA: Diagnosis not present

## 2016-11-20 DIAGNOSIS — Z7984 Long term (current) use of oral hypoglycemic drugs: Secondary | ICD-10-CM | POA: Diagnosis not present

## 2016-11-20 DIAGNOSIS — Z79899 Other long term (current) drug therapy: Secondary | ICD-10-CM | POA: Diagnosis not present

## 2016-11-20 DIAGNOSIS — Z8542 Personal history of malignant neoplasm of other parts of uterus: Secondary | ICD-10-CM | POA: Insufficient documentation

## 2016-11-20 DIAGNOSIS — R1011 Right upper quadrant pain: Secondary | ICD-10-CM | POA: Insufficient documentation

## 2016-11-20 DIAGNOSIS — I10 Essential (primary) hypertension: Secondary | ICD-10-CM | POA: Insufficient documentation

## 2016-11-20 DIAGNOSIS — E119 Type 2 diabetes mellitus without complications: Secondary | ICD-10-CM | POA: Diagnosis not present

## 2016-11-20 DIAGNOSIS — R1013 Epigastric pain: Secondary | ICD-10-CM | POA: Diagnosis present

## 2016-11-20 LAB — COMPREHENSIVE METABOLIC PANEL
ALK PHOS: 99 U/L (ref 38–126)
ALT: 18 U/L (ref 14–54)
AST: 27 U/L (ref 15–41)
Albumin: 4.1 g/dL (ref 3.5–5.0)
Anion gap: 9 (ref 5–15)
BUN: 18 mg/dL (ref 6–20)
CALCIUM: 9.3 mg/dL (ref 8.9–10.3)
CHLORIDE: 102 mmol/L (ref 101–111)
CO2: 25 mmol/L (ref 22–32)
CREATININE: 0.89 mg/dL (ref 0.44–1.00)
Glucose, Bld: 133 mg/dL — ABNORMAL HIGH (ref 65–99)
Potassium: 4.1 mmol/L (ref 3.5–5.1)
SODIUM: 136 mmol/L (ref 135–145)
TOTAL PROTEIN: 7.4 g/dL (ref 6.5–8.1)
Total Bilirubin: 0.8 mg/dL (ref 0.3–1.2)

## 2016-11-20 LAB — CBC WITH DIFFERENTIAL/PLATELET
Basophils Absolute: 0 10*3/uL (ref 0–0.1)
Basophils Relative: 1 %
EOS ABS: 0.1 10*3/uL (ref 0–0.7)
Eosinophils Relative: 1 %
HEMATOCRIT: 37 % (ref 35.0–47.0)
HEMOGLOBIN: 12.6 g/dL (ref 12.0–16.0)
LYMPHS ABS: 1.1 10*3/uL (ref 1.0–3.6)
LYMPHS PCT: 17 %
MCH: 33.2 pg (ref 26.0–34.0)
MCHC: 34 g/dL (ref 32.0–36.0)
MCV: 97.8 fL (ref 80.0–100.0)
Monocytes Absolute: 0.4 10*3/uL (ref 0.2–0.9)
Monocytes Relative: 7 %
NEUTROS ABS: 4.8 10*3/uL (ref 1.4–6.5)
NEUTROS PCT: 74 %
Platelets: 193 10*3/uL (ref 150–440)
RBC: 3.78 MIL/uL — AB (ref 3.80–5.20)
RDW: 13.2 % (ref 11.5–14.5)
WBC: 6.4 10*3/uL (ref 3.6–11.0)

## 2016-11-20 LAB — URINALYSIS, COMPLETE (UACMP) WITH MICROSCOPIC
Bilirubin Urine: NEGATIVE
GLUCOSE, UA: NEGATIVE mg/dL
HGB URINE DIPSTICK: NEGATIVE
Ketones, ur: NEGATIVE mg/dL
Nitrite: NEGATIVE
Protein, ur: NEGATIVE mg/dL
SPECIFIC GRAVITY, URINE: 1.012 (ref 1.005–1.030)
pH: 6 (ref 5.0–8.0)

## 2016-11-20 LAB — LIPASE, BLOOD: LIPASE: 27 U/L (ref 11–51)

## 2016-11-20 MED ORDER — IOPAMIDOL (ISOVUE-300) INJECTION 61%
30.0000 mL | Freq: Once | INTRAVENOUS | Status: AC | PRN
  Administered 2016-11-20: 30 mL via ORAL

## 2016-11-20 MED ORDER — OXYCODONE-ACETAMINOPHEN 5-325 MG PO TABS: 1.0000 | ORAL_TABLET | ORAL | 0 refills | Status: AC | PRN

## 2016-11-20 MED ORDER — IOPAMIDOL (ISOVUE-300) INJECTION 61%
100.0000 mL | Freq: Once | INTRAVENOUS | Status: AC | PRN
  Administered 2016-11-20: 100 mL via INTRAVENOUS

## 2016-11-20 MED ORDER — MORPHINE SULFATE (PF) 2 MG/ML IV SOLN
2.0000 mg | Freq: Once | INTRAVENOUS | Status: AC
  Administered 2016-11-20: 2 mg via INTRAVENOUS
  Filled 2016-11-20: qty 1

## 2016-11-20 MED ORDER — ONDANSETRON 4 MG PO TBDP: 4.0000 mg | ORAL_TABLET | Freq: Three times a day (TID) | ORAL | 0 refills | Status: AC | PRN

## 2016-11-20 MED ORDER — ONDANSETRON HCL 4 MG/2ML IJ SOLN
4.0000 mg | Freq: Once | INTRAMUSCULAR | Status: AC
  Administered 2016-11-20: 4 mg via INTRAVENOUS
  Filled 2016-11-20: qty 2

## 2016-11-20 NOTE — ED Provider Notes (Signed)
[facility] Emergency Department Provider Note    First MD Initiated Contact with Patient 11/20/16 [phone]     (approximate)  I have reviewed the triage vital signs and the nursing notes.   HISTORY  Chief Complaint Flank Pain    HPI Frances [name] is a 71 y.o. female with blows of chronic medical conditions including uterine cancer status post resection and chemotherapy presents to the emergency department with 2 day history of right upper quadrant/epigastric abdominal pain accompanied by nausea. Patient denies any vomiting. Patient denies any constipation or diarrhea. Patient denies any fever. Patient denies any urinary symptoms no hematuria dysuria or frequency or urgency.   Past Medical History:  Diagnosis Date  . Diabetes mellitus without complication ([place])   . Hypertension   . Uterine cancer ([place])     There are no active problems to display for this patient.   Past Surgical History:  Procedure Laterality Date  . CHOLECYSTECTOMY  11-08-12   ERCP with stent(Dr Oh) and  Physiomesh herniation defect repair  . HAND SURGERY Left 2013   Carpel Tunnel   . TUBAL LIGATION  1980    Prior to Admission medications   Medication Sig Start Date End Date Taking? Authorizing Provider  aspirin 81 MG tablet Take 81 mg by mouth daily.   Yes [provider]  colchicine 0.6 MG tablet Take 2 tabs PO x 1, then 1 tab PO 1 hour later x 1  Max: 1.8 mg total dose per attack, do not repeat within 3 days. 05/20/15  Yes Menshew, Dannielle Karvonen, PA-C  gabapentin (NEURONTIN) 300 MG capsule Take 300 mg by mouth 2 (two) times daily as needed. 01/22/16  Yes [provider]  lisinopril (PRINIVIL,ZESTRIL) 40 MG tablet Take 40 mg by mouth daily.  09/05/12  Yes [provider]  loratadine (CLARITIN) 10 MG tablet Take 10 mg by mouth daily. 04/17/16  Yes [provider]  metFORMIN (GLUCOPHAGE) 500 MG tablet Take 1 tablet by mouth 2 (two) times daily  with a meal.  09/04/12  Yes [provider]  cephALEXin (KEFLEX) 500 MG capsule Take 1 capsule (500 mg total) by mouth 3 (three) times daily. Patient not taking: Reported on 11/20/2016 05/29/16   Nance Pear, MD  HYDROcodone-acetaminophen [facility]) 5-325 MG tablet Take 1 tablet by mouth every 6 (six) hours as needed for moderate pain. Patient not taking: Reported on 05/29/2016 05/20/15   Menshew, Dannielle Karvonen, PA-C  ondansetron (ZOFRAN ODT) 4 MG disintegrating tablet Take 1 tablet (4 mg total) by mouth every 8 (eight) hours as needed for nausea or vomiting. 11/20/16   Gregor Hams, MD  oxyCODONE-acetaminophen (ROXICET) 5-325 MG tablet Take 1 tablet by mouth every 4 (four) hours as needed for severe pain. 11/20/16   Gregor Hams, MD    Allergies No known drug allergies  Family History  Problem Relation Age of Onset  . Breast cancer Mother 68  . Breast cancer Sister        early 30's-twice    Social History Social History  Substance Use Topics  . Smoking status: Never Smoker  . Smokeless tobacco: Never Used  . Alcohol use No    Review of Systems Constitutional: No fever/chills Eyes: No visual changes. ENT: No sore throat. Cardiovascular: Denies chest pain. Respiratory: Denies shortness of breath. Gastrointestinal: Positive for abdominal pain and nausea, no vomiting.  No diarrhea.  No constipation. Genitourinary: Negative for dysuria. Musculoskeletal: Negative for neck pain.  Negative for back pain. Integumentary: Negative for rash. Neurological: Negative for headaches, focal weakness or numbness.   ____________________________________________   PHYSICAL EXAM:  VITAL SIGNS: ED Triage Vitals [11/20/16 0341]  Enc Vitals Group     BP (!) 166/95     Pulse Rate 65     Resp 18     Temp 98.3 F (36.8 C)     Temp Source Oral     SpO2 98 %     Weight 90.3 kg (199 lb)     Height 1.702 m (5\' 7" )     Head Circumference      Peak Flow      Pain Score 8      Pain Loc      Pain Edu?      Excl. in [place]?     Constitutional: Alert and oriented. Well appearing and in no acute distress. Eyes: Conjunctivae are normal. Head: Atraumatic. Mouth/Throat: Mucous membranes are moist.  Oropharynx non-erythematous. Neck: No stridor.  Cardiovascular: Normal rate, regular rhythm. Good peripheral circulation. Grossly normal heart sounds. Respiratory: Normal respiratory effort.  No retractions. Lungs CTAB. Gastrointestinal: Right upper quadrant/epigastric tenderness to palpation. No distention.  Musculoskeletal: No lower extremity tenderness nor edema. No gross deformities of extremities. Neurologic:  Normal speech and language. No gross focal neurologic deficits are appreciated.  Skin:  Skin is warm, dry and intact. No rash noted. Psychiatric: Mood and affect are normal. Speech and behavior are normal.  ____________________________________________   LABS (all labs ordered are listed, but only abnormal results are displayed)  Labs Reviewed  CBC WITH DIFFERENTIAL/PLATELET - Abnormal; Notable for the following:       Result Value   RBC 3.78 (*)    All other components within normal limits  COMPREHENSIVE METABOLIC PANEL - Abnormal; Notable for the following:    Glucose, Bld 133 (*)    All other components within normal limits  URINALYSIS, COMPLETE (UACMP) WITH MICROSCOPIC - Abnormal; Notable for the following:    Color, Urine STRAW (*)    APPearance CLEAR (*)    Leukocytes, UA SMALL (*)    Bacteria, UA MANY (*)    Squamous Epithelial / LPF 0-5 (*)    All other components within normal limits  LIPASE, BLOOD     RADIOLOGY I, [place] BR[place], personally viewed and evaluated these images (plain radiographs) as part of my medical decision making, as well as reviewing the written report by the radiologist.  Ct Abdomen Pelvis W Contrast  Result Date: 11/20/2016 CLINICAL DATA:  Right upper quadrant and epigastric abdominal pain. EXAM: CT ABDOMEN AND  PELVIS WITH CONTRAST TECHNIQUE: Multidetector CT imaging of the abdomen and pelvis was performed using the standard protocol following bolus administration of intravenous contrast. CONTRAST:  193mL ISOVUE-300 IOPAMIDOL (ISOVUE-300) INJECTION 61% COMPARISON:  Abdominal CT 05/29/2016 FINDINGS: Lower chest: No consolidation or pleural fluid. Hepatobiliary: No focal hepatic lesion. Postcholecystectomy with decreased pneumobilia from prior exam. There is intra and extrahepatic biliary dilatation with common bile duct measuring 15 mm at the porta hepatis. Pancreas: Parenchymal atrophy. No ductal dilatation or inflammation. Spleen: Normal in size without focal abnormality. Adrenals/Urinary Tract: No adrenal nodule. No hydronephrosis. Extrarenal pelvis configuration bilaterally. Homogeneous renal enhancement with symmetric excretion on delayed phase imaging. Tiny subcentimeter hypodensity in the anterior left kidney is too small to characterize. Urinary bladder is physiologically distended. No bladder wall thickening. Stomach/Bowel: Stomach distended with ingested contrast. No gastric wall thickening. No abnormal small bowel distention, inflammation or wall thickening. Normal  appendix. Moderate colonic stool burden without colonic wall thickening. Vascular/Lymphatic: Left periaortic retroperitoneal soft tissue mass/ lymph node measures 3.3 x 2.9 cm, at the level of the lower left kidney, overall similar in size allowing for differences in caliper placement. There is decreased surrounding soft tissue stranding from prior exam. New 2.5 x 1.8 cm aortocaval node at the level of the SMA. Additional smaller retroperitoneal nodes are not enlarged by size criteria. There is no pelvic adenopathy. Normal caliber abdominal aorta with moderate atherosclerosis. No aneurysm. Reproductive: Status post hysterectomy. No adnexal masses. Other: No ascites. No free air. Postsurgical changes the anterior abdominal wall. Tiny fat containing  upper ventral abdominal wall hernia. Musculoskeletal: Advanced facet arthropathy in the lower lumbar spine with grade 1 anterolisthesis of L3 on L4 and L4 on L5. No acute osseous abnormality. No focal osseous lesion is seen. IMPRESSION: 1. Persistent enlarged retroperitoneal lymph node, similar in size to prior allowing for differences in caliper placement. The previous surrounding inflammatory stranding has resolved. There is a new enlarged retroperitoneal node at the level of the SMA. Given reported history of uterine cancer, findings are concerning for metastatic disease. Lymphoma is also considered but felt less likely. Consider PET-CT or tissue sampling. 2. Postcholecystectomy with increased biliary dilatation from prior. Given normal LFTs, this is likely postsurgical. 3. Distended stomach without gastric wall thickening or findings of gastric outlet obstruction, can be seen with gastroparesis. 4.  Aortic Atherosclerosis (ICD10-I70.0). Electronically Signed   By: Jeb Levering M.D.   On: 11/20/2016 06:37     Procedures   ____________________________________________   INITIAL IMPRESSION / ASSESSMENT AND PLAN / ED COURSE  As part of my medical decision making, I reviewed the following data within the electronic MEDICAL RECORD NUMBER 71 year old female with above history of physical, concerning CT scan from May 2018 exam concerning for possible metastatic cancer. I spoke with the patient at length regarding the May CT scan which was performed to which she states that she was not informed. Giving presentation CT scan of the abdomen pelvis was performed which revealed findings consistent with previous which are concerning for potential metastatic cancer. Patient given IV morphine and Zofran emergency department improvement of pain and nausea. I emphasized the importance of the patient following up with Dr. Fransisca Connors her oncologist at [facility]. Patient will be referred to Dr. Fransisca Connors for further outpatient  evaluation and management. Patient given Percocet and Zofran for home.     ____________________________________________  FINAL CLINICAL IMPRESSION(S) / ED DIAGNOSES  Final diagnoses:  Lymphadenopathy, abdominal     MEDICATIONS GIVEN DURING THIS VISIT:  Medications  morphine 2 MG/ML injection 2 mg (2 mg Intravenous Given 11/20/16 0501)  ondansetron (ZOFRAN) injection 4 mg (4 mg Intravenous Given 11/20/16 0501)  iopamidol (ISOVUE-300) 61 % injection 30 mL (30 mLs Oral Contrast Given 11/20/16 0508)  iopamidol (ISOVUE-300) 61 % injection 100 mL (100 mLs Intravenous Contrast Given 11/20/16 0603)     NEW OUTPATIENT MEDICATIONS STARTED DURING THIS VISIT:  New Prescriptions   ONDANSETRON (ZOFRAN ODT) 4 MG DISINTEGRATING TABLET    Take 1 tablet (4 mg total) by mouth every 8 (eight) hours as needed for nausea or vomiting.   OXYCODONE-ACETAMINOPHEN (ROXICET) 5-325 MG TABLET    Take 1 tablet by mouth every 4 (four) hours as needed for severe pain.    Modified Medications   No medications on file    Discontinued Medications   No medications on file     Note:  This document  was prepared using Systems analyst and may include unintentional dictation errors.    Gregor Hams, MD 11/20/16 [phone]

## 2016-11-20 NOTE — ED Triage Notes (Signed)
Pt in with co right flank pain x 2, denies any dysuria or fever at this time.

## 2016-11-28 ENCOUNTER — Telehealth: Payer: Self-pay

## 2016-11-28 DIAGNOSIS — C541 Malignant neoplasm of endometrium: Secondary | ICD-10-CM

## 2016-11-28 NOTE — Telephone Encounter (Signed)
  Oncology Nurse Navigator Documentation Contacted Frances [name] at the request of Dr. Fransisca Connors. Recent CT scan performed in the ED with enlarged retroperitoneal lymph nodes. He would like a biopsy performed at [facility] or [facility], patient preference. She will have biopsy at [facility]. Orders entered and invasive checklist faxed to specialty scheduling. Navigator Location: [facility] (11/28/16 1300)   )Navigator Encounter Type: Telephone;Diagnostic Results (11/28/16 1300) Telephone: Outgoing Call;Diagnostic Results (11/28/16 1300)                                                  Time Spent with Patient: 30 (11/28/16 1300)

## 2016-11-28 NOTE — Telephone Encounter (Signed)
Contacted Frances [name] at the request of Dr. Fransisca Connors regarding a recent CT scan performed in the ED. It shows enlarged retroperitoneal lymph nodes and he would like a biopsy of this at [place] or at [facility], patient preference. She will have biopsy performed at [facility]. Orders entered and invasive checklist faxed to specialty scheduling with confirmation of receipt.

## 2016-12-08 ENCOUNTER — Other Ambulatory Visit: Payer: Self-pay | Admitting: Radiology

## 2016-12-09 ENCOUNTER — Ambulatory Visit
Admission: RE | Admit: 2016-12-09 | Discharge: 2016-12-09 | Disposition: A | Discharge status: Home / Self Care | Payer: Medicare Other | Source: Ambulatory Visit | Attending: Obstetrics and Gynecology | Admitting: Obstetrics and Gynecology

## 2016-12-09 DIAGNOSIS — E119 Type 2 diabetes mellitus without complications: Secondary | ICD-10-CM | POA: Insufficient documentation

## 2016-12-09 DIAGNOSIS — Z7984 Long term (current) use of oral hypoglycemic drugs: Secondary | ICD-10-CM | POA: Insufficient documentation

## 2016-12-09 DIAGNOSIS — C772 Secondary and unspecified malignant neoplasm of intra-abdominal lymph nodes: Secondary | ICD-10-CM | POA: Insufficient documentation

## 2016-12-09 DIAGNOSIS — Z7982 Long term (current) use of aspirin: Secondary | ICD-10-CM | POA: Insufficient documentation

## 2016-12-09 DIAGNOSIS — Z803 Family history of malignant neoplasm of breast: Secondary | ICD-10-CM | POA: Diagnosis not present

## 2016-12-09 DIAGNOSIS — I10 Essential (primary) hypertension: Secondary | ICD-10-CM | POA: Diagnosis not present

## 2016-12-09 DIAGNOSIS — Z8542 Personal history of malignant neoplasm of other parts of uterus: Secondary | ICD-10-CM | POA: Diagnosis not present

## 2016-12-09 DIAGNOSIS — C541 Malignant neoplasm of endometrium: Secondary | ICD-10-CM

## 2016-12-09 DIAGNOSIS — Z79899 Other long term (current) drug therapy: Secondary | ICD-10-CM | POA: Insufficient documentation

## 2016-12-09 DIAGNOSIS — R590 Localized enlarged lymph nodes: Secondary | ICD-10-CM | POA: Diagnosis present

## 2016-12-09 DIAGNOSIS — Z90710 Acquired absence of both cervix and uterus: Secondary | ICD-10-CM | POA: Insufficient documentation

## 2016-12-09 LAB — CBC
HEMATOCRIT: 36.3 % (ref 35.0–47.0)
Hemoglobin: 12.5 g/dL (ref 12.0–16.0)
MCH: 33.5 pg (ref 26.0–34.0)
MCHC: 34.4 g/dL (ref 32.0–36.0)
MCV: 97.6 fL (ref 80.0–100.0)
Platelets: 207 10*3/uL (ref 150–440)
RBC: 3.72 MIL/uL — ABNORMAL LOW (ref 3.80–5.20)
RDW: 12.5 % (ref 11.5–14.5)
WBC: 3.4 10*3/uL — ABNORMAL LOW (ref 3.6–11.0)

## 2016-12-09 LAB — PROTIME-INR
INR: 0.96
Prothrombin Time: 12.7 seconds (ref 11.4–15.2)

## 2016-12-09 LAB — GLUCOSE, CAPILLARY: Glucose-Capillary: 113 mg/dL — ABNORMAL HIGH (ref 65–99)

## 2016-12-09 LAB — APTT: APTT: 26 s (ref 24–36)

## 2016-12-09 MED ORDER — MIDAZOLAM HCL 2 MG/2ML IJ SOLN
INTRAMUSCULAR | Status: AC | PRN
  Administered 2016-12-09: 1 mg via INTRAVENOUS

## 2016-12-09 MED ORDER — LIDOCAINE HCL 1 % IJ SOLN
INTRAMUSCULAR | Status: AC | PRN
  Administered 2016-12-09: 10 mL

## 2016-12-09 MED ORDER — SODIUM CHLORIDE 0.9 % IV SOLN
INTRAVENOUS | Status: DC
  Administered 2016-12-09: 11:00:00 via INTRAVENOUS

## 2016-12-09 MED ORDER — FENTANYL CITRATE (PF) 100 MCG/2ML IJ SOLN
INTRAMUSCULAR | Status: AC | PRN
  Administered 2016-12-09: 50 ug via INTRAVENOUS

## 2016-12-09 MED ORDER — HYDROCODONE-ACETAMINOPHEN 5-325 MG PO TABS
1.0000 | ORAL_TABLET | ORAL | Status: DC | PRN
  Filled 2016-12-09: qty 2

## 2016-12-09 NOTE — Discharge Instructions (Signed)
Moderate Conscious Sedation, Adult, Care After °These instructions provide you with information about caring for yourself after your procedure. Your health care provider may also give you more specific instructions. Your treatment has been planned according to current medical practices, but problems sometimes occur. Call your health care provider if you have any problems or questions after your procedure. °What can I expect after the procedure? °After your procedure, it is common: °· To feel sleepy for several hours. °· To feel clumsy and have poor balance for several hours. °· To have poor judgment for several hours. °· To vomit if you eat too soon. ° °Follow these instructions at home: °For at least 24 hours after the procedure: ° °· Do not: °? Participate in activities where you could fall or become injured. °? Drive. °? Use heavy machinery. °? Drink alcohol. °? Take sleeping pills or medicines that cause drowsiness. °? Make important decisions or sign legal documents. °? Take care of children on your own. °· Rest. °Eating and drinking °· Follow the diet recommended by your health care provider. °· If you vomit: °? Drink water, juice, or soup when you can drink without vomiting. °? Make sure you have little or no nausea before eating solid foods. °General instructions °· Have a responsible adult stay with you until you are awake and alert. °· Take over-the-counter and prescription medicines only as told by your health care provider. °· If you smoke, do not smoke without supervision. °· Keep all follow-up visits as told by your health care provider. This is important. °Contact a health care provider if: °· You keep feeling nauseous or you keep vomiting. °· You feel light-headed. °· You develop a rash. °· You have a fever. °Get help right away if: °· You have trouble breathing. °This information is not intended to replace advice given to you by your health care provider. Make sure you discuss any questions you have  with your health care provider. °Document Released: 10/27/2012 Document Revised: 06/11/2015 Document Reviewed: 04/28/2015 °Elsevier Interactive Patient Education © 2018 [employer] °Open Lymph Node Biopsy, Care After °Refer to this sheet in the next few weeks. These instructions provide you with information about caring for yourself after your procedure. Your health care provider may also give you more specific instructions. Your treatment has been planned according to current medical practices, but problems sometimes occur. Call your health care provider if you have any problems or questions after your procedure. °What can I expect after the procedure? °After the procedure, it is common to have: °· Bruising. °· Soreness. °· Mild swelling. ° °Follow these instructions at home: °Medicines °· Take over-the-counter and prescription medicines only as told by your health care provider. °· If you were prescribed an antibiotic medicine, take it as told by your health care provider. Do not stop taking the antibiotic even if you start to feel better. °Incision care ° °· Follow instructions from your health care provider about how to take care of your incision. Make sure you: °? Wash your hands with soap and water before you change your bandage (dressing). If soap and water are not available, use hand sanitizer. °? Change your dressing as told by your health care provider. °? Leave stitches (sutures), skin glue, or adhesive strips in place. These skin closures may need to stay in place for 2 weeks or longer. If adhesive strip edges start to loosen and curl up, you may trim the loose edges. Do not remove adhesive strips completely unless your   health care provider tells you to do that. °· Check your incision area every day for signs of infection. Check for: °? More redness, swelling, or pain. °? More fluid or blood. °? Warmth. °? Pus or a bad smell. °Driving °· Do not drive for 24 hours if you received a sedative. °· Do not  drive or operate heavy machinery while taking prescription pain medicine. °General instructions ° °· It is your responsibility to get the results of your procedure. Ask your health care provider or the department performing the procedure when your results will be ready. °· Return to your normal activities as told by your health care provider. Ask your health care provider what activities are safe for you. °· Do not take baths, swim, or use a hot tub until your health care provider approves. °· Wear compression stockings as told by your health care provider. These stockings help to prevent blood clots and reduce swelling in your legs. °· Keep all follow-up visits as told by your health care provider. This is important. °Contact a health care provider if: °· You have more redness, swelling, or pain around your incision. °· You have more fluid or blood coming from your incision. °· Your incision feels warm to the touch. °· You have pus or a bad smell coming from your incision. °· You have a fever. °· You have pain or numbness that gets worse or lasts longer than a few days. °This information is not intended to replace advice given to you by your health care provider. Make sure you discuss any questions you have with your health care provider. °Document Released: 02/02/2015 Document Revised: 06/14/2015 Document Reviewed: 05/03/2014 °Elsevier Interactive Patient Education © 2018 [employer] ° °

## 2016-12-09 NOTE — Procedures (Signed)
CT guided cores of left retroperitoneal lymphadenopathy.  Multiple cores obtained.  One core placed on Telfa pad for flow if needed.  Minimal blood loss and no immediate complication.

## 2016-12-09 NOTE — H&P (Signed)
Chief Complaint: Patient was seen in consultation today for retroperitoneal LN bx at the request of [facility]  Referring Physician(s): [place]  Supervising Physician: Markus Daft  Patient Status: [facility] - Out-pt  History of Present Illness: Frances [name] is a 71 y.o. female with prior hx of uterine cancer. She is found to have retroperitoneal adenopathy and is referred for percutaneous biopsy. PMHx, meds, labs, imaging reviewed. Has been NPO this am Daughter at bedside  Past Medical History:  Diagnosis Date  . Diabetes mellitus without complication ([place])   . Hypertension   . Uterine cancer [facility])     Past Surgical History:  Procedure Laterality Date  . CHOLECYSTECTOMY  11-08-12   ERCP with stent(Dr Oh) and  Physiomesh herniation defect repair  . HAND SURGERY Left 2013   Carpel Tunnel   . TUBAL LIGATION  1980    Allergies: Patient has no known allergies.  Medications: Prior to Admission medications   Medication Sig Start Date End Date Taking? Authorizing Provider  aspirin 81 MG tablet Take 81 mg by mouth daily.   Yes [provider]  colchicine 0.6 MG tablet Take 2 tabs PO x 1, then 1 tab PO 1 hour later x 1  Max: 1.8 mg total dose per attack, do not repeat within 3 days. 05/20/15  Yes Menshew, Dannielle Karvonen, PA-C  gabapentin (NEURONTIN) 300 MG capsule Take 300 mg by mouth 2 (two) times daily as needed. 01/22/16  Yes [provider]  HYDROcodone-acetaminophen (NORCO) 5-325 MG tablet Take 1 tablet by mouth every 6 (six) hours as needed for moderate pain. 05/20/15  Yes Menshew, Dannielle Karvonen, PA-C  lisinopril (PRINIVIL,ZESTRIL) 40 MG tablet Take 40 mg by mouth daily.  09/05/12  Yes [provider]  loratadine (CLARITIN) 10 MG tablet Take 10 mg by mouth daily. 04/17/16  Yes [provider]  metFORMIN (GLUCOPHAGE) 500 MG tablet Take 1 tablet by mouth 2 (two) times daily with a meal.  09/04/12  Yes [provider]    ondansetron (ZOFRAN ODT) 4 MG disintegrating tablet Take 1 tablet (4 mg total) by mouth every 8 (eight) hours as needed for nausea or vomiting. 11/20/16  Yes Gregor Hams, MD  oxyCODONE-acetaminophen (ROXICET) 5-325 MG tablet Take 1 tablet by mouth every 4 (four) hours as needed for severe pain. 11/20/16  Yes Gregor Hams, MD  cephALEXin (KEFLEX) 500 MG capsule Take 1 capsule (500 mg total) by mouth 3 (three) times daily. Patient not taking: Reported on 11/20/2016 05/29/16   Nance Pear, MD     Family History  Problem Relation Age of Onset  . Breast cancer Mother 74  . Breast cancer Sister        early 30's-twice    Social History   Socioeconomic History  . Marital status: Divorced    Spouse name: Not on file  . Number of children: Not on file  . Years of education: Not on file  . Highest education level: Not on file  Social Needs  . Financial resource strain: Not on file  . Food insecurity - worry: Not on file  . Food insecurity - inability: Not on file  . Transportation needs - medical: Not on file  . Transportation needs - non-medical: Not on file  Occupational History  . Not on file  Tobacco Use  . Smoking status: Never Smoker  . Smokeless tobacco: Never Used  Substance and Sexual Activity  . Alcohol use: No  . Drug use: No  .  Sexual activity: Not on file  Other Topics Concern  . Not on file  Social History Narrative  . Not on file     Review of Systems: A 12 point ROS discussed and pertinent positives are indicated in the HPI above.  All other systems are negative.  Review of Systems  Vital Signs: BP 138/80   Pulse (!) 58   Temp 98.4 F (36.9 C) (Oral)   Resp 20   Ht 5\' 7"  (1.702 m)   Wt 199 lb (90.3 kg)   SpO2 98%   BMI 31.17 kg/m   Physical Exam  Constitutional: She is oriented to person, place, and time. She appears well-developed and well-nourished. No distress.  HENT:  Head: Normocephalic.  Mouth/Throat: Oropharynx is clear and  moist.  Neck: Normal range of motion. No tracheal deviation present.  Cardiovascular: Normal rate, regular rhythm and normal heart sounds.  Pulmonary/Chest: Effort normal and breath sounds normal. No respiratory distress.  Abdominal: Soft. Bowel sounds are normal. She exhibits no distension. There is no tenderness.  Neurological: She is alert and oriented to person, place, and time.  Skin: Skin is warm and dry.  Psychiatric: She has a normal mood and affect.    Imaging: Ct Abdomen Pelvis W Contrast  Result Date: 11/20/2016 CLINICAL DATA:  Right upper quadrant and epigastric abdominal pain. EXAM: CT ABDOMEN AND PELVIS WITH CONTRAST TECHNIQUE: Multidetector CT imaging of the abdomen and pelvis was performed using the standard protocol following bolus administration of intravenous contrast. CONTRAST:  141mL ISOVUE-300 IOPAMIDOL (ISOVUE-300) INJECTION 61% COMPARISON:  Abdominal CT 05/29/2016 FINDINGS: Lower chest: No consolidation or pleural fluid. Hepatobiliary: No focal hepatic lesion. Postcholecystectomy with decreased pneumobilia from prior exam. There is intra and extrahepatic biliary dilatation with common bile duct measuring 15 mm at the porta hepatis. Pancreas: Parenchymal atrophy. No ductal dilatation or inflammation. Spleen: Normal in size without focal abnormality. Adrenals/Urinary Tract: No adrenal nodule. No hydronephrosis. Extrarenal pelvis configuration bilaterally. Homogeneous renal enhancement with symmetric excretion on delayed phase imaging. Tiny subcentimeter hypodensity in the anterior left kidney is too small to characterize. Urinary bladder is physiologically distended. No bladder wall thickening. Stomach/Bowel: Stomach distended with ingested contrast. No gastric wall thickening. No abnormal small bowel distention, inflammation or wall thickening. Normal appendix. Moderate colonic stool burden without colonic wall thickening. Vascular/Lymphatic: Left periaortic retroperitoneal soft  tissue mass/ lymph node measures 3.3 x 2.9 cm, at the level of the lower left kidney, overall similar in size allowing for differences in caliper placement. There is decreased surrounding soft tissue stranding from prior exam. New 2.5 x 1.8 cm aortocaval node at the level of the SMA. Additional smaller retroperitoneal nodes are not enlarged by size criteria. There is no pelvic adenopathy. Normal caliber abdominal aorta with moderate atherosclerosis. No aneurysm. Reproductive: Status post hysterectomy. No adnexal masses. Other: No ascites. No free air. Postsurgical changes the anterior abdominal wall. Tiny fat containing upper ventral abdominal wall hernia. Musculoskeletal: Advanced facet arthropathy in the lower lumbar spine with grade 1 anterolisthesis of L3 on L4 and L4 on L5. No acute osseous abnormality. No focal osseous lesion is seen. IMPRESSION: 1. Persistent enlarged retroperitoneal lymph node, similar in size to prior allowing for differences in caliper placement. The previous surrounding inflammatory stranding has resolved. There is a new enlarged retroperitoneal node at the level of the SMA. Given reported history of uterine cancer, findings are concerning for metastatic disease. Lymphoma is also considered but felt less likely. Consider PET-CT or tissue sampling. 2. Postcholecystectomy with  increased biliary dilatation from prior. Given normal LFTs, this is likely postsurgical. 3. Distended stomach without gastric wall thickening or findings of gastric outlet obstruction, can be seen with gastroparesis. 4.  Aortic Atherosclerosis (ICD10-I70.0). Electronically Signed   By: Jeb Levering M.D.   On: 11/20/2016 06:37    Labs:  CBC: Recent Labs    05/29/16 2038 11/20/16 0506  WBC 5.8 6.4  HGB 11.9* 12.6  HCT 34.7* 37.0  PLT 163 193    COAGS: No results for input(s): INR, APTT in the last 8760 hours.  BMP: Recent Labs    05/29/16 2038 11/20/16 0506  NA 139 136  K 3.9 4.1  CL 105  102  CO2 27 25  GLUCOSE 162* 133*  BUN 18 18  CALCIUM 9.2 9.3  CREATININE 1.03* 0.89  GFRNONAA 53* >60  GFRAA >60 >60    LIVER FUNCTION TESTS: Recent Labs    05/29/16 2038 11/20/16 0506  BILITOT 0.5 0.8  AST 28 27  ALT 17 18  ALKPHOS 79 99  PROT 7.0 7.4  ALBUMIN 3.9 4.1    TUMOR MARKERS: No results for input(s): AFPTM, CEA, CA199, CHROMGRNA in the last 8760 hours.  Assessment and Plan: Retroperitoneal adenopathy For CT guided LN biopsy Labs pending Risks and benefits discussed with the patient including, but not limited to bleeding, infection, damage to adjacent structures or low yield requiring additional tests. All of the patient's questions were answered, patient is agreeable to proceed. Consent signed and in chart.    Thank you for this interesting consult.  I greatly enjoyed meeting Elvenia P Maruyama and look forward to participating in their care.  A copy of this report was sent to the requesting provider on this date.  Electronically Signed: Ascencion Dike, PA-C 12/09/2016, 10:39 AM   I spent a total of 20 minutes in face to face in clinical consultation, greater than 50% of which was counseling/coordinating care for RP LN Bx

## 2016-12-10 LAB — SURGICAL PATHOLOGY

## 2017-03-31 ENCOUNTER — Other Ambulatory Visit: Payer: Self-pay | Admitting: Internal Medicine

## 2017-03-31 DIAGNOSIS — Z1231 Encounter for screening mammogram for malignant neoplasm of breast: Secondary | ICD-10-CM

## 2017-05-04 ENCOUNTER — Ambulatory Visit
Admission: RE | Admit: 2017-05-04 | Discharge: 2017-05-04 | Disposition: A | Discharge status: Home / Self Care | Payer: Medicare Other | Source: Ambulatory Visit | Attending: Internal Medicine | Admitting: Internal Medicine

## 2017-05-04 DIAGNOSIS — Z1231 Encounter for screening mammogram for malignant neoplasm of breast: Secondary | ICD-10-CM | POA: Diagnosis present

## 2017-05-04 HISTORY — DX: Personal history of antineoplastic chemotherapy: Z92.21

## 2017-05-27 ENCOUNTER — Emergency Department
Admission: EM | Admit: 2017-05-27 | Discharge: 2017-05-27 | Disposition: A | Discharge status: Home / Self Care | Payer: Medicare Other | Attending: Emergency Medicine | Admitting: Emergency Medicine

## 2017-05-27 ENCOUNTER — Other Ambulatory Visit: Payer: Self-pay

## 2017-05-27 ENCOUNTER — Encounter: Payer: Self-pay | Admitting: Emergency Medicine

## 2017-05-27 DIAGNOSIS — R630 Anorexia: Secondary | ICD-10-CM | POA: Diagnosis present

## 2017-05-27 DIAGNOSIS — Z7984 Long term (current) use of oral hypoglycemic drugs: Secondary | ICD-10-CM | POA: Diagnosis not present

## 2017-05-27 DIAGNOSIS — C55 Malignant neoplasm of uterus, part unspecified: Secondary | ICD-10-CM | POA: Diagnosis not present

## 2017-05-27 DIAGNOSIS — N390 Urinary tract infection, site not specified: Secondary | ICD-10-CM

## 2017-05-27 DIAGNOSIS — R531 Weakness: Secondary | ICD-10-CM

## 2017-05-27 DIAGNOSIS — Z7982 Long term (current) use of aspirin: Secondary | ICD-10-CM | POA: Insufficient documentation

## 2017-05-27 DIAGNOSIS — I10 Essential (primary) hypertension: Secondary | ICD-10-CM | POA: Insufficient documentation

## 2017-05-27 DIAGNOSIS — E119 Type 2 diabetes mellitus without complications: Secondary | ICD-10-CM | POA: Insufficient documentation

## 2017-05-27 LAB — URINALYSIS, COMPLETE (UACMP) WITH MICROSCOPIC
Bilirubin Urine: NEGATIVE
Glucose, UA: NEGATIVE mg/dL
KETONES UR: NEGATIVE mg/dL
Nitrite: POSITIVE — AB
PH: 5 (ref 5.0–8.0)
PROTEIN: 100 mg/dL — AB
Specific Gravity, Urine: 1.02 (ref 1.005–1.030)

## 2017-05-27 LAB — DIFFERENTIAL
BASOS ABS: 0 10*3/uL (ref 0–0.1)
BASOS PCT: 1 %
Eosinophils Absolute: 0 10*3/uL (ref 0–0.7)
Eosinophils Relative: 1 %
Lymphocytes Relative: 50 %
Lymphs Abs: 0.9 10*3/uL — ABNORMAL LOW (ref 1.0–3.6)
Monocytes Absolute: 0 10*3/uL — ABNORMAL LOW (ref 0.2–0.9)
Monocytes Relative: 2 %
NEUTROS ABS: 0.9 10*3/uL — AB (ref 1.4–6.5)
Neutrophils Relative %: 48 %

## 2017-05-27 LAB — COMPREHENSIVE METABOLIC PANEL
ALBUMIN: 4 g/dL (ref 3.5–5.0)
ALK PHOS: 81 U/L (ref 38–126)
ALT: 22 U/L (ref 14–54)
AST: 27 U/L (ref 15–41)
Anion gap: 9 (ref 5–15)
BUN: 30 mg/dL — ABNORMAL HIGH (ref 6–20)
CO2: 28 mmol/L (ref 22–32)
CREATININE: 1.27 mg/dL — AB (ref 0.44–1.00)
Calcium: 9 mg/dL (ref 8.9–10.3)
Chloride: 96 mmol/L — ABNORMAL LOW (ref 101–111)
GFR calc non Af Amer: 41 mL/min — ABNORMAL LOW (ref >60)
GFR, EST AFRICAN AMERICAN: 47 mL/min — AB (ref >60)
GLUCOSE: 179 mg/dL — AB (ref 65–99)
Potassium: 4 mmol/L (ref 3.5–5.1)
SODIUM: 133 mmol/L — AB (ref 135–145)
Total Bilirubin: 1 mg/dL (ref 0.3–1.2)
Total Protein: 7.5 g/dL (ref 6.5–8.1)

## 2017-05-27 LAB — CBC
HCT: 35.4 % (ref 35.0–47.0)
Hemoglobin: 12.5 g/dL (ref 12.0–16.0)
MCH: 35.6 pg — AB (ref 26.0–34.0)
MCHC: 35.3 g/dL (ref 32.0–36.0)
MCV: 101 fL — AB (ref 80.0–100.0)
PLATELETS: 247 10*3/uL (ref 150–440)
RBC: 3.5 MIL/uL — AB (ref 3.80–5.20)
RDW: 14.4 % (ref 11.5–14.5)
WBC: 2 10*3/uL — ABNORMAL LOW (ref 3.6–11.0)

## 2017-05-27 LAB — LIPASE, BLOOD: Lipase: 26 U/L (ref 11–51)

## 2017-05-27 MED ORDER — SULFAMETHOXAZOLE-TRIMETHOPRIM 800-160 MG PO TABS: 1.0000 | ORAL_TABLET | Freq: Two times a day (BID) | ORAL | 0 refills | Status: AC

## 2017-05-27 MED ORDER — ONDANSETRON HCL 4 MG/2ML IJ SOLN
4.0000 mg | Freq: Once | INTRAMUSCULAR | Status: AC
  Administered 2017-05-27: 4 mg via INTRAVENOUS
  Filled 2017-05-27: qty 2

## 2017-05-27 MED ORDER — SODIUM CHLORIDE 0.9 % IV BOLUS
1000.0000 mL | Freq: Once | INTRAVENOUS | Status: AC
  Administered 2017-05-27: 1000 mL via INTRAVENOUS

## 2017-05-27 MED ORDER — SODIUM CHLORIDE 0.9 % IV SOLN
1.0000 g | Freq: Once | INTRAVENOUS | Status: AC
  Administered 2017-05-27: 1 g via INTRAVENOUS
  Filled 2017-05-27: qty 10

## 2017-05-27 NOTE — ED Notes (Signed)
First Nurse Note:  Patient to room 5 via [place], helped to put on gown, in bed with siderails up, bed in low position.  Claiborne Billings RN aware of placement in room.

## 2017-05-27 NOTE — ED Provider Notes (Signed)
-----------------------------------------   6:20 PM on 05/27/2017 -----------------------------------------  I took over care on this patient from Dr. Burlene Arnt.  The patient presented with decreased appetite and some generalized weakness.  Her lab work-up was reassuring.  At the time of signout, the plan was to follow-up on her urinalysis and reassess.  The patient expressed to Dr. Burlene Arnt that she would strongly prefer to go home if she was feeling better.  UA has resulted and is consistent with acute UTI.  Patient was treated for UTI recently although the cultures at that time showed no growth.  The patient states she is feeling much better after fluids and still would like to go home.  At this time, based on the current evaluation, I believe that she is appropriate for outpatient treatment.  She believes that the Keflex that she was prescribed last time seem to to precipitate a gout flareup, so she would like to avoid taking it again if possible.  Given that the culture last time did not show any resistant bacteria, I will choose an alternate agent.  Patient agrees with this plan.  Return precautions given, and she expressed understanding.  She agrees to follow-up with her regular doctor.   Arta Silence, MD 05/27/17 [facility]

## 2017-05-27 NOTE — ED Triage Notes (Signed)
Had Chemo on Friday for lung cancer.  Patient states since then has been unable to eat or drink anything since Friday. States everything just tastes bad.  Has had some episode of emesis.

## 2017-05-27 NOTE — Discharge Instructions (Addendum)
Take the antibiotic as prescribed and finish the full course.  Return to the ER for new, worsening, persistent weakness, lightheadedness, vomiting or inability to take the medication, fevers, or any other new or worsening symptoms that concern you.  Follow-up with your regular doctor.

## 2017-05-27 NOTE — ED Notes (Signed)
Pt alert and oriented X4, active, cooperative, pt in NAD. RR even and unlabored, color WNL.  Pt informed to return if any life threatening symptoms occur.  Discharge and followup instructions reviewed.  

## 2017-05-27 NOTE — ED Notes (Signed)
Pt eating ice and aware we need a urine sample.

## 2017-05-27 NOTE — ED Provider Notes (Addendum)
[facility] Emergency Department Provider Note  ____________________________________________   I have reviewed the triage vital signs and the nursing notes. Where available I have reviewed prior notes and, if possible and indicated, outside hospital notes.    HISTORY  Chief Complaint not eating or drinking    HPI Frances [name] is a 72 y.o. female a history of cancer, whose had her last chemotherapy on Friday, she states since that time she is just had decreased appetite, everything tastes chalky.  She denies any fever or chills.  Denies any nausea or vomiting.  She denies any diarrhea.  She has been a little bit constipated.  She states that she just does not feel like eating or drinking because of the chemo.  She has had this before.  She denies any headache or stiff neck or any other complaint.  It is her hope that she can get IV fluid and then go home    Past Medical History:  Diagnosis Date  . Diabetes mellitus without complication ([place])   . Hypertension   . Personal history of chemotherapy current   for uterine ca  . Uterine cancer ([place])     There are no active problems to display for this patient.   Past Surgical History:  Procedure Laterality Date  . CHOLECYSTECTOMY  11-08-12   ERCP with stent(Dr Oh) and  Physiomesh herniation defect repair  . HAND SURGERY Left 2013   Carpel Tunnel   . TUBAL LIGATION  1980    Prior to Admission medications   Medication Sig Start Date End Date Taking? Authorizing Provider  aspirin 81 MG tablet Take 81 mg by mouth daily.    [provider]  cephALEXin (KEFLEX) 500 MG capsule Take 1 capsule (500 mg total) by mouth 3 (three) times daily. Patient not taking: Reported on 11/20/2016 05/29/16   Nance Pear, MD  colchicine 0.6 MG tablet Take 2 tabs PO x 1, then 1 tab PO 1 hour later x 1  Max: 1.8 mg total dose per attack, do not repeat within 3 days. 05/20/15   Menshew, Dannielle Karvonen, PA-C   gabapentin (NEURONTIN) 300 MG capsule Take 300 mg by mouth 2 (two) times daily as needed. 01/22/16   [provider]  HYDROcodone-acetaminophen (NORCO) 5-325 MG tablet Take 1 tablet by mouth every 6 (six) hours as needed for moderate pain. 05/20/15   Menshew, Dannielle Karvonen, PA-C  lisinopril (PRINIVIL,ZESTRIL) 40 MG tablet Take 40 mg by mouth daily.  09/05/12   [provider]  loratadine (CLARITIN) 10 MG tablet Take 10 mg by mouth daily. 04/17/16   [provider]  metFORMIN (GLUCOPHAGE) 500 MG tablet Take 1 tablet by mouth 2 (two) times daily with a meal.  09/04/12   [provider]  ondansetron (ZOFRAN ODT) 4 MG disintegrating tablet Take 1 tablet (4 mg total) by mouth every 8 (eight) hours as needed for nausea or vomiting. 11/20/16   Gregor Hams, MD  oxyCODONE-acetaminophen (ROXICET) 5-325 MG tablet Take 1 tablet by mouth every 4 (four) hours as needed for severe pain. 11/20/16   Gregor Hams, MD    Allergies Patient has no known allergies.  Family History  Problem Relation Age of Onset  . Breast cancer Mother 57  . Breast cancer Sister        early 30's-twice    Social History Social History   Tobacco Use  . Smoking status: Never Smoker  . Smokeless tobacco: Never Used  Substance Use Topics  . Alcohol use: No  . Drug use: No    Review of Systems Constitutional: No fever/chills Eyes: No visual changes. ENT: No sore throat. No stiff neck no neck pain Cardiovascular: Denies chest pain. Respiratory: Denies shortness of breath. Gastrointestinal:   no vomiting.  No diarrhea.  No constipation. Genitourinary: Negative for dysuria. Musculoskeletal: Negative lower extremity swelling Skin: Negative for rash. Neurological: Negative for severe headaches, focal weakness or numbness.   ____________________________________________   PHYSICAL EXAM:  VITAL SIGNS: ED Triage Vitals  Enc Vitals Group     BP 05/27/17 0958 (!) 124/101      Pulse Rate 05/27/17 0958 (!) 101     Resp 05/27/17 0958 16     Temp 05/27/17 0958 98.3 F (36.8 C)     Temp Source 05/27/17 0958 Oral     SpO2 05/27/17 0958 100 %     Weight 05/27/17 0955 200 lb (90.7 kg)     Height 05/27/17 0955 5\' 6"  (1.676 m)     Head Circumference --      Peak Flow --      Pain Score 05/27/17 0955 0     Pain Loc --      Pain Edu? --      Excl. in [place]? --     Constitutional: Alert and oriented. Well appearing and in no acute distress. Eyes: Conjunctivae are normal Head: Atraumatic HEENT: No congestion/rhinnorhea. Mucous membranes are dry.  Oropharynx non-erythematous Neck:   Nontender with no meningismus, no masses, no stridor Cardiovascular: Normal rate, regular rhythm. Grossly normal heart sounds.  Good peripheral circulation. Respiratory: Normal respiratory effort.  No retractions. Lungs CTAB. Abdominal: Soft and nontender. No distention. No guarding no rebound Back:  There is no focal tenderness or step off.  there is no midline tenderness there are no lesions noted. there is no CVA tenderness Musculoskeletal: No lower extremity tenderness, no upper extremity tenderness. No joint effusions, no DVT signs strong distal pulses no edema Neurologic:  Normal speech and language. No gross focal neurologic deficits are appreciated.  Skin:  Skin is warm, dry and intact. No rash noted. Psychiatric: Mood and affect are normal. Speech and behavior are normal.  ____________________________________________   LABS (all labs ordered are listed, but only abnormal results are displayed)  Labs Reviewed  COMPREHENSIVE METABOLIC PANEL - Abnormal; Notable for the following components:      Result Value   Sodium 133 (*)    Chloride 96 (*)    Glucose, Bld 179 (*)    BUN 30 (*)    Creatinine, Ser 1.27 (*)    GFR calc non Af Amer 41 (*)    GFR calc Af Amer 47 (*)    All other components within normal limits  CBC - Abnormal; Notable for the following components:   WBC 2.0  (*)    RBC 3.50 (*)    MCV 101.0 (*)    MCH 35.6 (*)    All other components within normal limits  LIPASE, BLOOD  URINALYSIS, COMPLETE (UACMP) WITH MICROSCOPIC    Pertinent labs  results that were available during my care of the patient were reviewed by me and considered in my medical decision making (see chart for details). ____________________________________________  EKG  I personally interpreted any EKGs ordered by me or triage  ____________________________________________  RADIOLOGY  Pertinent labs & imaging results that were available during my care of the patient were reviewed by me and considered in my medical decision  making (see chart for details). If possible, patient and/or family made aware of any abnormal findings.  No results found. ____________________________________________    PROCEDURES  Procedure(s) performed: None  Procedures  Critical Care performed: None  ____________________________________________   INITIAL IMPRESSION / ASSESSMENT AND PLAN / ED COURSE  Pertinent labs & imaging results that were available during my care of the patient were reviewed by me and considered in my medical decision making (see chart for details).  She did no acute distress reassuring vital signs, however has not been eating or drinking at home after chemotherapy no evidence of infection we will check a urinalysis.  We will give her IV fluid and we will see if she will be able to eat and drink and go home which is her strong preference.  ----------------------------------------- 2:05 PM on 05/27/2017 -----------------------------------------  In no acute distress, feels much better after fluid we will continue to hydrate her we are waiting urine sample, blood work is reassuring, is drinking.  We did give her Zofran.    ----------------------------------------- 3:55 PM on 05/27/2017 -----------------------------------------  Signed out to Dr. Deboraha Sprang at the end of  my shift pending urinalysis.  Patient feels much better and would like to go home if possible. If  She does have UTI think she should likely be admitted with IV antibiotics however  Clinical Course as of Jun 03 2002  Wed May 27, 2017  1730 Leukocytes, UA(!): LARGE [SS]    Clinical Course User Index [SS] Arta Silence, MD   ____________________________________________   FINAL CLINICAL IMPRESSION(S) / ED DIAGNOSES  Final diagnoses:  None      This chart was dictated using voice recognition software.  Despite best efforts to proofread,  errors can occur which can change meaning.      Schuyler Amor, MD 05/27/17 1138    Schuyler Amor, MD 05/27/17 1405    Schuyler Amor, MD 05/27/17 1555    Schuyler Amor, MD 06/02/17 2004

## 2017-08-07 ENCOUNTER — Other Ambulatory Visit: Payer: Self-pay | Admitting: Internal Medicine

## 2017-08-07 DIAGNOSIS — R55 Syncope and collapse: Secondary | ICD-10-CM

## 2017-08-07 DIAGNOSIS — G4452 New daily persistent headache (NDPH): Secondary | ICD-10-CM

## 2017-08-13 ENCOUNTER — Ambulatory Visit: Payer: Medicare Other

## 2017-09-30 ENCOUNTER — Other Ambulatory Visit: Payer: Self-pay | Admitting: Physician Assistant

## 2017-09-30 DIAGNOSIS — M5432 Sciatica, left side: Secondary | ICD-10-CM

## 2017-09-30 DIAGNOSIS — M25552 Pain in left hip: Secondary | ICD-10-CM

## 2017-10-08 ENCOUNTER — Ambulatory Visit (HOSPITAL_COMMUNITY)
Admission: RE | Admit: 2017-10-08 | Discharge: 2017-10-08 | Disposition: A | Discharge status: Home / Self Care | Payer: Medicare Other | Source: Ambulatory Visit | Attending: Physician Assistant | Admitting: Physician Assistant

## 2017-10-08 DIAGNOSIS — M47896 Other spondylosis, lumbar region: Secondary | ICD-10-CM | POA: Insufficient documentation

## 2017-10-08 DIAGNOSIS — M48061 Spinal stenosis, lumbar region without neurogenic claudication: Secondary | ICD-10-CM | POA: Diagnosis not present

## 2017-10-08 DIAGNOSIS — M5432 Sciatica, left side: Secondary | ICD-10-CM

## 2017-10-08 DIAGNOSIS — M5442 Lumbago with sciatica, left side: Secondary | ICD-10-CM | POA: Insufficient documentation

## 2017-10-08 DIAGNOSIS — M25552 Pain in left hip: Secondary | ICD-10-CM

## 2017-10-08 DIAGNOSIS — M4316 Spondylolisthesis, lumbar region: Secondary | ICD-10-CM | POA: Insufficient documentation

## 2017-10-08 DIAGNOSIS — M47897 Other spondylosis, lumbosacral region: Secondary | ICD-10-CM | POA: Insufficient documentation

## 2017-11-05 ENCOUNTER — Emergency Department: Payer: Medicare Other

## 2017-11-05 ENCOUNTER — Emergency Department
Admission: EM | Admit: 2017-11-05 | Discharge: 2017-11-05 | Disposition: A | Discharge status: Other Acute Inpatient Hospital | Payer: Medicare Other | Attending: Emergency Medicine | Admitting: Emergency Medicine

## 2017-11-05 ENCOUNTER — Other Ambulatory Visit: Payer: Self-pay

## 2017-11-05 ENCOUNTER — Encounter: Payer: Self-pay | Admitting: *Deleted

## 2017-11-05 DIAGNOSIS — G936 Cerebral edema: Secondary | ICD-10-CM | POA: Insufficient documentation

## 2017-11-05 DIAGNOSIS — Z7984 Long term (current) use of oral hypoglycemic drugs: Secondary | ICD-10-CM | POA: Diagnosis not present

## 2017-11-05 DIAGNOSIS — I10 Essential (primary) hypertension: Secondary | ICD-10-CM | POA: Insufficient documentation

## 2017-11-05 DIAGNOSIS — Z7982 Long term (current) use of aspirin: Secondary | ICD-10-CM | POA: Insufficient documentation

## 2017-11-05 DIAGNOSIS — E119 Type 2 diabetes mellitus without complications: Secondary | ICD-10-CM | POA: Diagnosis not present

## 2017-11-05 DIAGNOSIS — Z79899 Other long term (current) drug therapy: Secondary | ICD-10-CM | POA: Insufficient documentation

## 2017-11-05 DIAGNOSIS — C541 Malignant neoplasm of endometrium: Secondary | ICD-10-CM | POA: Insufficient documentation

## 2017-11-05 DIAGNOSIS — C7931 Secondary malignant neoplasm of brain: Secondary | ICD-10-CM | POA: Insufficient documentation

## 2017-11-05 DIAGNOSIS — R410 Disorientation, unspecified: Secondary | ICD-10-CM | POA: Diagnosis present

## 2017-11-05 DIAGNOSIS — Z9049 Acquired absence of other specified parts of digestive tract: Secondary | ICD-10-CM | POA: Insufficient documentation

## 2017-11-05 LAB — COMPREHENSIVE METABOLIC PANEL
ALBUMIN: 4.2 g/dL (ref 3.5–5.0)
ALK PHOS: 436 U/L — AB (ref 38–126)
ALT: 30 U/L (ref 0–44)
ANION GAP: 7 (ref 5–15)
AST: 35 U/L (ref 15–41)
BUN: 17 mg/dL (ref 8–23)
CALCIUM: 9.6 mg/dL (ref 8.9–10.3)
CO2: 28 mmol/L (ref 22–32)
Chloride: 103 mmol/L (ref 98–111)
Creatinine, Ser: 0.92 mg/dL (ref 0.44–1.00)
GFR calc non Af Amer: >60 mL/min (ref >60)
GLUCOSE: 159 mg/dL — AB (ref 70–99)
Potassium: 4 mmol/L (ref 3.5–5.1)
Sodium: 138 mmol/L (ref 135–145)
TOTAL PROTEIN: 7.2 g/dL (ref 6.5–8.1)
Total Bilirubin: 0.4 mg/dL (ref 0.3–1.2)

## 2017-11-05 LAB — CBC
HCT: 33.7 % — ABNORMAL LOW (ref 36.0–46.0)
Hemoglobin: 11.4 g/dL — ABNORMAL LOW (ref 12.0–15.0)
MCH: 32.9 pg (ref 26.0–34.0)
MCHC: 33.8 g/dL (ref 30.0–36.0)
MCV: 97.1 fL (ref 80.0–100.0)
PLATELETS: 218 10*3/uL (ref 150–400)
RBC: 3.47 MIL/uL — AB (ref 3.87–5.11)
RDW: 12.8 % (ref 11.5–15.5)
WBC: 7 10*3/uL (ref 4.0–10.5)
nRBC: 0 % (ref 0.0–0.2)

## 2017-11-05 MED ORDER — DEXAMETHASONE SODIUM PHOSPHATE 10 MG/ML IJ SOLN
10.0000 mg | Freq: Once | INTRAMUSCULAR | Status: AC
  Administered 2017-11-05: 10 mg via INTRAVENOUS
  Filled 2017-11-05: qty 1

## 2017-11-05 MED ORDER — LISINOPRIL 10 MG PO TABS
40.0000 mg | ORAL_TABLET | Freq: Once | ORAL | Status: AC
  Administered 2017-11-05: 40 mg via ORAL
  Filled 2017-11-05: qty 4

## 2017-11-05 NOTE — ED Triage Notes (Signed)
Pt brought in by son.  Pt reports for past 3 days she has had memory problems.  Pt has been taking meloxcam and tylenol for hip pain and thinks the meds are causing her to have memory loss. Pt alert.  Speech clear.  No headache.

## 2017-11-05 NOTE — ED Notes (Signed)
EMTALA reviewed and appears complete prior to pt transport.

## 2017-11-05 NOTE — ED Notes (Signed)
Report given to charge RN at [facility] for bed 6328.

## 2017-11-05 NOTE — ED Provider Notes (Signed)
[facility] Emergency Department Provider Note  ____________________________________________  Time seen: Approximately 9:57 PM  I have reviewed the triage vital signs and the nursing notes.   HISTORY  Chief Complaint Memory Loss   HPI Frances [name] is a 72 y.o. female with a history of metastatic endometrial cancer currently not on chemotherapy and followed at [facility] who presents for evaluation progressively worsening confusion for 3 days.  According to the son and patient she has been more confused over the last 3 days.  Patient has good insight into her confusion.  She denies headache, fever or chills, slurred speech, facial droop, or unilateral weakness or numbness.  According to the son patient is usually very sharp and has no history of dementia.  Son has called patient's oncologist who recommended a visit to the emergency room for a head CT. Past Medical History:  Diagnosis Date  . Diabetes mellitus without complication ([place])   . Hypertension   . Personal history of chemotherapy current   for uterine ca  . Uterine cancer ([place])     There are no active problems to display for this patient.   Past Surgical History:  Procedure Laterality Date  . CHOLECYSTECTOMY  11-08-12   ERCP with stent(Dr Oh) and  Physiomesh herniation defect repair  . HAND SURGERY Left 2013   Carpel Tunnel   . TUBAL LIGATION  1980    Prior to Admission medications   Medication Sig Start Date End Date Taking? Authorizing Provider  aspirin 81 MG tablet Take 81 mg by mouth daily.    [provider]  cephALEXin (KEFLEX) 500 MG capsule Take 1 capsule (500 mg total) by mouth 3 (three) times daily. Patient not taking: Reported on 11/20/2016 05/29/16   Nance Pear, MD  colchicine 0.6 MG tablet Take 2 tabs PO x 1, then 1 tab PO 1 hour later x 1  Max: 1.8 mg total dose per attack, do not repeat within 3 days. 05/20/15   Menshew, Dannielle Karvonen, PA-C  gabapentin  (NEURONTIN) 300 MG capsule Take 300 mg by mouth 2 (two) times daily as needed. 01/22/16   [provider]  HYDROcodone-acetaminophen (NORCO) 5-325 MG tablet Take 1 tablet by mouth every 6 (six) hours as needed for moderate pain. 05/20/15   Menshew, Dannielle Karvonen, PA-C  lisinopril (PRINIVIL,ZESTRIL) 40 MG tablet Take 40 mg by mouth daily.  09/05/12   [provider]  loratadine (CLARITIN) 10 MG tablet Take 10 mg by mouth daily. 04/17/16   [provider]  metFORMIN (GLUCOPHAGE) 500 MG tablet Take 1 tablet by mouth 2 (two) times daily with a meal.  09/04/12   [provider]  ondansetron (ZOFRAN ODT) 4 MG disintegrating tablet Take 1 tablet (4 mg total) by mouth every 8 (eight) hours as needed for nausea or vomiting. 11/20/16   Gregor Hams, MD  oxyCODONE-acetaminophen (ROXICET) 5-325 MG tablet Take 1 tablet by mouth every 4 (four) hours as needed for severe pain. 11/20/16   Gregor Hams, MD    Allergies Patient has no known allergies.  Family History  Problem Relation Age of Onset  . Breast cancer Mother 44  . Breast cancer Sister        early 30's-twice    Social History Social History   Tobacco Use  . Smoking status: Never Smoker  . Smokeless tobacco: Never Used  Substance Use Topics  . Alcohol use: No  . Drug use: No    Review  of Systems  Constitutional: Negative for fever. + confusion Eyes: Negative for visual changes. ENT: Negative for sore throat. Neck: No neck pain  Cardiovascular: Negative for chest pain. Respiratory: Negative for shortness of breath. Gastrointestinal: Negative for abdominal pain, vomiting or diarrhea. Genitourinary: Negative for dysuria. Musculoskeletal: Negative for back pain. Skin: Negative for rash. Neurological: Negative for headaches, weakness or numbness. Psych: No SI or HI  ____________________________________________   PHYSICAL EXAM:  VITAL SIGNS: ED Triage Vitals  Enc Vitals Group     BP  11/05/17 2145 (!) 193/104     Pulse Rate 11/05/17 1929 83     Resp 11/05/17 1929 20     Temp 11/05/17 1929 98.8 F (37.1 C)     Temp Source 11/05/17 1929 Oral     SpO2 11/05/17 1929 99 %     Weight 11/05/17 1930 180 lb (81.6 kg)     Height 11/05/17 1930 5\' 8"  (1.727 m)     Head Circumference --      Peak Flow --      Pain Score 11/05/17 1930 10     Pain Loc --      Pain Edu? --      Excl. in [place]? --     Constitutional: Alert and oriented. Well appearing and in no apparent distress. HEENT:      Head: Normocephalic and atraumatic.         Eyes: Conjunctivae are normal. Sclera is non-icteric.       Mouth/Throat: Mucous membranes are moist.       Neck: Supple with no signs of meningismus. Cardiovascular: Regular rate and rhythm. No murmurs, gallops, or rubs. 2+ symmetrical distal pulses are present in all extremities. No JVD. Respiratory: Normal respiratory effort. Lungs are clear to auscultation bilaterally. No wheezes, crackles, or rhonchi.  Gastrointestinal: Soft, non tender, and non distended with positive bowel sounds. No rebound or guarding. Musculoskeletal: Nontender with normal range of motion in all extremities. No edema, cyanosis, or erythema of extremities. Neurologic: GCS 15. Normal speech and language. Face is symmetric. Moving all extremities. No gross focal neurologic deficits are appreciated. Skin: Skin is warm, dry and intact. No rash noted. Psychiatric: Mood and affect are normal. Speech and behavior are normal.  ____________________________________________   LABS (all labs ordered are listed, but only abnormal results are displayed)  Labs Reviewed  COMPREHENSIVE METABOLIC PANEL - Abnormal; Notable for the following components:      Result Value   Glucose, Bld 159 (*)    Alkaline Phosphatase 436 (*)    All other components within normal limits  CBC - Abnormal; Notable for the following components:   RBC 3.47 (*)    Hemoglobin 11.4 (*)    HCT 33.7 (*)    All  other components within normal limits   ____________________________________________  EKG  none  ____________________________________________  RADIOLOGY  I have personally reviewed the images performed during this visit and I agree with the Radiologist's read.   Interpretation by Radiologist:  Ct Head Wo Contrast  Result Date: 11/05/2017 CLINICAL DATA:  Altered mental status. EXAM: CT HEAD WITHOUT CONTRAST TECHNIQUE: Contiguous axial images were obtained from the base of the skull through the vertex without intravenous contrast. COMPARISON:  None. FINDINGS: Brain: There are masses in the right frontal lobe and at the left frontoparietal junction. The right-sided mass measures up to 1.6 cm and the left, 2.2 cm. There is moderate surrounding vasogenic edema. No other mass lesions are seen. There is no hemorrhage.  No hydrocephalus or significant mass effect on the ventricles. Vascular: No abnormal hyperdensity of the major intracranial arteries or dural venous sinuses. No intracranial atherosclerosis. Skull: The visualized skull base, calvarium and extracranial soft tissues are normal. Sinuses/Orbits: No fluid levels or advanced mucosal thickening of the visualized paranasal sinuses. No mastoid or middle ear effusion. The orbits are normal. IMPRESSION: 1. Bilateral masses of the superior cerebral hemispheres with surrounding moderate vasogenic edema. The pattern is most suggestive of metastatic disease. MRI is recommended for more complete characterization. 2. No hemorrhage, midline shift or hydrocephalus. Electronically Signed   By: Ulyses Jarred M.D.   On: 11/05/2017 20:15     ____________________________________________   PROCEDURES  Procedure(s) performed: None Procedures Critical Care performed: yes  CRITICAL CARE Performed by: Rudene Re  ?  Total critical care time: 35 min  Critical care time was exclusive of separately billable procedures and treating other  patients.  Critical care was necessary to treat or prevent imminent or life-threatening deterioration.  Critical care was time spent personally by me on the following activities: development of treatment plan with patient and/or surrogate as well as nursing, discussions with consultants, evaluation of patient's response to treatment, examination of patient, obtaining history from patient or surrogate, ordering and performing treatments and interventions, ordering and review of laboratory studies, ordering and review of radiographic studies, pulse oximetry and re-evaluation of patient's condition.  ____________________________________________   INITIAL IMPRESSION / ASSESSMENT AND PLAN / ED COURSE   71 y.o. female with a history of metastatic endometrial cancer currently not on chemotherapy and followed at [facility] who presents for evaluation progressively worsening confusion for 3 days.  Patient has a GCS of 15, alert and oriented x3 and neuro intact.  CT head was done which is concerning for metastatic disease to the brain with moderate amount of vasogenic edema.  Will start patient on IV Decadron.  Discussed with Dr. Shon Baton from [facility] at [facility] who accepted transfer to stepdown at [facility] for further management under the care of Dr. Theora Gianotti. Patient and son informed of results of CT and need for transfer and are both in agreement with plan. Patient awaiting bed availability. BP elevated, patient has not taken her dose of lisinopril which will be given at this time.      As part of my medical decision making, I reviewed the following data within the [place] History obtained from family, Nursing notes reviewed and incorporated, Labs reviewed , Old chart reviewed, Radiograph reviewed , A consult was requested and obtained from this/these consultant(s) [facility], Notes from prior ED visits and [place] Controlled Substance Database    Pertinent labs & imaging results that  were available during my care of the patient were reviewed by me and considered in my medical decision making (see chart for details).    ____________________________________________   FINAL CLINICAL IMPRESSION(S) / ED DIAGNOSES  Final diagnoses:  Brain metastasis ([place])  Vasogenic brain edema ([place])      NEW MEDICATIONS STARTED DURING THIS VISIT:  ED Discharge Orders    None       Note:  This document was prepared using Dragon voice recognition software and may include unintentional dictation errors.    Rudene Re, MD 11/05/17 [phone]

## 2017-11-05 NOTE — ED Notes (Signed)
[facility] called for transport to [place]

## 2017-11-05 NOTE — ED Notes (Signed)
Patient reports for last 3 days, she has been more forgetful with things that she remembers/uses every day. For example, patient can't remember left hand from right hand.

## 2017-11-05 NOTE — ED Notes (Signed)
Head ct scan per dr Clearnce Hasten.

## 2018-03-28 ENCOUNTER — Emergency Department: Payer: Medicare Other

## 2018-03-28 ENCOUNTER — Other Ambulatory Visit: Payer: Self-pay

## 2018-03-28 ENCOUNTER — Emergency Department
Admission: EM | Admit: 2018-03-28 | Discharge: 2018-03-28 | Disposition: A | Discharge status: Home / Self Care | Payer: Medicare Other | Attending: Student in an Organized Health Care Education/Training Program | Admitting: Student in an Organized Health Care Education/Training Program

## 2018-03-28 DIAGNOSIS — W06XXXA Fall from bed, initial encounter: Secondary | ICD-10-CM | POA: Diagnosis not present

## 2018-03-28 DIAGNOSIS — Z7984 Long term (current) use of oral hypoglycemic drugs: Secondary | ICD-10-CM | POA: Diagnosis not present

## 2018-03-28 DIAGNOSIS — Y999 Unspecified external cause status: Secondary | ICD-10-CM | POA: Insufficient documentation

## 2018-03-28 DIAGNOSIS — E119 Type 2 diabetes mellitus without complications: Secondary | ICD-10-CM | POA: Diagnosis not present

## 2018-03-28 DIAGNOSIS — Y92009 Unspecified place in unspecified non-institutional (private) residence as the place of occurrence of the external cause: Secondary | ICD-10-CM

## 2018-03-28 DIAGNOSIS — I10 Essential (primary) hypertension: Secondary | ICD-10-CM | POA: Diagnosis not present

## 2018-03-28 DIAGNOSIS — Z7982 Long term (current) use of aspirin: Secondary | ICD-10-CM | POA: Diagnosis not present

## 2018-03-28 DIAGNOSIS — Z79899 Other long term (current) drug therapy: Secondary | ICD-10-CM | POA: Diagnosis not present

## 2018-03-28 DIAGNOSIS — M79604 Pain in right leg: Secondary | ICD-10-CM | POA: Diagnosis not present

## 2018-03-28 DIAGNOSIS — S0990XA Unspecified injury of head, initial encounter: Secondary | ICD-10-CM | POA: Diagnosis present

## 2018-03-28 DIAGNOSIS — S0511XA Contusion of eyeball and orbital tissues, right eye, initial encounter: Secondary | ICD-10-CM | POA: Diagnosis not present

## 2018-03-28 DIAGNOSIS — Y92003 Bedroom of unspecified non-institutional (private) residence as the place of occurrence of the external cause: Secondary | ICD-10-CM | POA: Insufficient documentation

## 2018-03-28 DIAGNOSIS — R208 Other disturbances of skin sensation: Secondary | ICD-10-CM | POA: Diagnosis not present

## 2018-03-28 DIAGNOSIS — Y9389 Activity, other specified: Secondary | ICD-10-CM | POA: Insufficient documentation

## 2018-03-28 DIAGNOSIS — S0083XA Contusion of other part of head, initial encounter: Secondary | ICD-10-CM | POA: Insufficient documentation

## 2018-03-28 DIAGNOSIS — W19XXXA Unspecified fall, initial encounter: Secondary | ICD-10-CM

## 2018-03-28 NOTE — Discharge Instructions (Signed)
Please call and schedule an appointment with your primary care provider to further discuss the sensation in the right leg.  Return to the ER for symptoms that change or worsen if unable to schedule an appointment.

## 2018-03-28 NOTE — ED Triage Notes (Addendum)
Pt presents via POV c/o fall last PM. C/o RLE pain and head pain. Hematoma noted to left eye and left side of head. Pt A&O x4.

## 2018-03-28 NOTE — ED Triage Notes (Signed)
Pt reports falling off side of bed last PM. Denies LOC. A&Ox4. Denies anticoagulation. NAD.

## 2018-03-28 NOTE — ED Provider Notes (Addendum)
[facility] Emergency Department Provider Note  ____________________________________________   First MD Initiated Contact with Patient 03/28/18 [phone]     (approximate)  I have reviewed the triage vital signs and the nursing notes.   HISTORY  Chief Complaint Fall and Leg Pain   HPI Frances [name] is a 73 y.o. female who presents to the emergency department for treatment and evaluation after falling off the side of the bed this morning around 4:00 am. She denies loss of consciousness. She states that she was sitting on the foot of the bed watching tv and fell asleep while sitting up. She fell forward and hit the floor before she could catch herself. She managed to get herself up and back into bed without assistance. Upon awakening, she noticed that she had a knot on her forehead and a black eye which prompted her ER visit.   Additionally, she has had right lower extremity "coolness" that has progressively worsened over the past 4 days. She reports severe sciatica on the left that has left her weak and unable to walk around without assistance. She denies injury to the right leg related to the fall or prior to.        Past Medical History:  Diagnosis Date  . Diabetes mellitus without complication ([place])   . Hypertension   . Personal history of chemotherapy current   for uterine ca  . Uterine cancer ([place])     There are no active problems to display for this patient.   Past Surgical History:  Procedure Laterality Date  . CHOLECYSTECTOMY  11-08-12   ERCP with stent(Dr Oh) and  Physiomesh herniation defect repair  . HAND SURGERY Left 2013   Carpel Tunnel   . TUBAL LIGATION  1980    Prior to Admission medications   Medication Sig Start Date End Date Taking? Authorizing Provider  aspirin 81 MG tablet Take 81 mg by mouth daily.    [provider]  cephALEXin (KEFLEX) 500 MG capsule Take 1 capsule (500 mg total) by mouth 3 (three) times  daily. Patient not taking: Reported on 11/20/2016 05/29/16   Nance Pear, MD  colchicine 0.6 MG tablet Take 2 tabs PO x 1, then 1 tab PO 1 hour later x 1  Max: 1.8 mg total dose per attack, do not repeat within 3 days. 05/20/15   Menshew, Dannielle Karvonen, PA-C  gabapentin (NEURONTIN) 300 MG capsule Take 300 mg by mouth 2 (two) times daily as needed. 01/22/16   [provider]  HYDROcodone-acetaminophen (NORCO) 5-325 MG tablet Take 1 tablet by mouth every 6 (six) hours as needed for moderate pain. 05/20/15   Menshew, Dannielle Karvonen, PA-C  lisinopril (PRINIVIL,ZESTRIL) 40 MG tablet Take 40 mg by mouth daily.  09/05/12   [provider]  loratadine (CLARITIN) 10 MG tablet Take 10 mg by mouth daily. 04/17/16   [provider]  metFORMIN (GLUCOPHAGE) 500 MG tablet Take 1 tablet by mouth 2 (two) times daily with a meal.  09/04/12   [provider]  ondansetron (ZOFRAN ODT) 4 MG disintegrating tablet Take 1 tablet (4 mg total) by mouth every 8 (eight) hours as needed for nausea or vomiting. 11/20/16   Gregor Hams, MD  oxyCODONE-acetaminophen (ROXICET) 5-325 MG tablet Take 1 tablet by mouth every 4 (four) hours as needed for severe pain. 11/20/16   Gregor Hams, MD    Allergies Patient has no known allergies.  Family History  Problem Relation Age  of Onset  . Breast cancer Mother 54  . Breast cancer Sister        early 30's-twice    Social History Social History   Tobacco Use  . Smoking status: Never Smoker  . Smokeless tobacco: Never Used  Substance Use Topics  . Alcohol use: No  . Drug use: No    Review of Systems  Constitutional: No fever/chills Eyes: No visual changes. Positive for right eyelid swelling and bruising. ENT: No sore throat. Negative for injury to the mouth or lips. Cardiovascular: Denies chest pain. Positive for right lower extremity "coolness" Respiratory: Denies shortness of breath. Gastrointestinal: No abdominal pain.  No  nausea, no vomiting. Genitourinary: Negative for dysuria. Musculoskeletal: Negative for back pain. Negative for extremity or hip pain. Skin: Negative for open wound. Positive for periorbital contusion on the right. Neurological: Negative for headaches, focal weakness or numbness. Negative for loss of consciousness. ____________________________________________   PHYSICAL EXAM:  VITAL SIGNS: ED Triage Vitals  Enc Vitals Group     BP 03/28/18 0908 (!) 142/66     Pulse Rate 03/28/18 0908 89     Resp 03/28/18 0908 14     Temp 03/28/18 0908 98.2 F (36.8 C)     Temp Source 03/28/18 0908 Oral     SpO2 03/28/18 0908 100 %     Weight 03/28/18 0909 145 lb (65.8 kg)     Height --      Head Circumference --      Peak Flow --      Pain Score 03/28/18 0908 6     Pain Loc --      Pain Edu? --      Excl. in [place]? --     Constitutional: Alert and oriented. Well appearing and in no acute distress. Eyes: Conjunctivae are normal. PERRL. EOMI without pain. Head: Atraumatic. Nose: No congestion/rhinnorhea. Mouth/Throat: Mucous membranes are moist.  Oropharynx non-erythematous. Neck: No stridor. Nexus Criteria is negative. Cardiovascular: Normal rate, regular rhythm. Grossly normal heart sounds.  Good peripheral circulation. Respiratory: Normal respiratory effort.  No retractions. Lungs CTAB. Gastrointestinal: Soft and nontender. No distention. No abdominal bruits. No CVA tenderness. Musculoskeletal: No lower extremity tenderness nor edema.  No joint effusions. Neurologic:  Normal speech and language. No gross focal neurologic deficits are appreciated. No gait instability. Skin:  Skin is warm, dry and intact. No rash noted. Psychiatric: Mood and affect are normal. Speech and behavior are normal.  ____________________________________________   LABS (all labs ordered are listed, but only abnormal results are displayed)  Labs Reviewed - No data to  display ____________________________________________  EKG  Not indicated. ____________________________________________  RADIOLOGY  ED MD interpretation:  No DVT of the right lower extremity per radiology.  CT of the head and face negative for acute findings per radiology.  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 Multiplanar CT image reconstructions of the maxillofacial structures were also generated. COMPARISON:  11/05/2017 FINDINGS: CT HEAD FINDINGS Brain: Stable bilateral hypodense intra-axial brain masses, 1 in the right frontal lobe and a second in the left frontal parietal junction as before. Right frontal lesion measures 16 mm and left parietal junction lesion measures 22 mm. Some improvement in the surrounding vasogenic edema compared  to the prior study. No herniation or hydrocephalus. No acute intracranial hemorrhage, midline shift, or extra-axial fluid collection. Cisterns are patent. No cerebellar abnormality. Right frontal scalp hematoma noted. Vascular: No hyperdense vessel or unexpected calcification. Skull: Normal. Negative for fracture or focal lesion. Other: None. CT MAXILLOFACIAL FINDINGS Osseous: No fracture or mandibular dislocation. No destructive process. Orbits: Right anterior frontal and orbital soft tissue swelling/bruising noted. Orbits are otherwise symmetric and intact. Globes intact. Lenses located. No intraorbital hematoma. Sinuses: Clear. Soft tissues: Right facial soft tissue swelling/bruising diffusely. IMPRESSION: Stable bilateral superior cerebral hemisphere brain metastases with improvement in the  vasogenic edema pattern. No acute intracranial hemorrhage, shift or hydrocephalus No acute facial bony trauma. Diffuse right facial soft tissue swelling/bruising. Electronically Signed   By: Jerilynn Mages.  Shick M.D.   On: 03/28/2018 11:08   US Venous Img Lower Unilateral Right  Result Date: 03/28/2018 CLINICAL DATA:  Decreased mobility EXAM: RIGHT LOWER EXTREMITY VENOUS DOPPLER ULTRASOUND TECHNIQUE: Gray-scale sonography with graded compression, as well as color Doppler and duplex ultrasound were performed to evaluate the lower extremity deep venous systems from the level of the common femoral vein and including the common femoral, femoral, profunda femoral, popliteal and calf veins including the posterior tibial, peroneal and gastrocnemius veins when visible. The superficial great saphenous vein was also interrogated. Spectral Doppler was utilized to evaluate flow at rest and with distal augmentation maneuvers in the common femoral, femoral and popliteal veins. COMPARISON:  None. FINDINGS: Contralateral Common Femoral Vein: Respiratory phasicity is normal and symmetric with the symptomatic side. No evidence of thrombus. Normal compressibility. Common Femoral Vein: No evidence of thrombus. Normal compressibility, respiratory phasicity and response to augmentation. Saphenofemoral Junction: No evidence of thrombus. Normal compressibility and flow on color Doppler imaging. Profunda Femoral Vein: No evidence of thrombus. Normal compressibility and flow on color Doppler imaging. Femoral Vein: No evidence of thrombus. Normal compressibility, respiratory phasicity and response to augmentation. Popliteal Vein: No evidence of thrombus. Normal compressibility, respiratory phasicity and response to augmentation. Calf Veins: No evidence of thrombus. Normal compressibility and flow on color Doppler imaging. Superficial Great Saphenous Vein: No evidence of thrombus. Normal compressibility. Venous Reflux:  None. Other Findings:  None.  IMPRESSION: No evidence of deep venous thrombosis. Electronically Signed   By: Inez Catalina M.D.   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 Multiplanar CT image reconstructions of the maxillofacial structures were also generated. COMPARISON:  11/05/2017 FINDINGS: CT HEAD FINDINGS Brain: Stable bilateral hypodense intra-axial brain masses, 1 in the right frontal lobe and a second in the left frontal parietal junction as before. Right frontal lesion measures 16 mm and left parietal junction lesion measures 22 mm. Some improvement in the surrounding vasogenic edema compared to the prior study. No herniation or hydrocephalus. No acute intracranial hemorrhage, midline shift, or extra-axial fluid collection. Cisterns are patent. No cerebellar abnormality. Right frontal scalp hematoma noted. Vascular: No hyperdense vessel or unexpected calcification. Skull: Normal. Negative for fracture or focal lesion. Other: None. CT MAXILLOFACIAL FINDINGS Osseous: No fracture or mandibular dislocation. No destructive process. Orbits: Right anterior frontal and orbital soft tissue swelling/bruising noted. Orbits are otherwise symmetric and intact. Globes intact. Lenses located. No intraorbital hematoma. Sinuses: Clear. Soft tissues: Right  facial soft tissue swelling/bruising diffusely. IMPRESSION: Stable bilateral superior cerebral hemisphere brain metastases with improvement in the vasogenic edema pattern. No acute intracranial hemorrhage, shift or hydrocephalus No acute facial bony trauma. Diffuse right facial soft tissue swelling/bruising. Electronically Signed   By:  Jerilynn Mages.  Shick M.D.   On: 03/28/2018 11:08    ____________________________________________   PROCEDURES  Procedure(s) performed (including Critical Care):  Procedures   ____________________________________________   INITIAL IMPRESSION / ASSESSMENT AND PLAN / ED COURSE   73 year old female presenting to the emergency department for treatment and evaluation after mechanical, non-syncopal fall in the early morning hours.  CT of the head and maxillofacial is reassuring.  The patient did not specifically come to the emergency department for treatment of the cool sensation she describes in the right lower extremity, however ultrasound was completed to make sure she did not have a DVT.  Ultrasound is negative.  There is a 2+ DP and PT pulse in the right lower extremity.  In comparison to the left the temperature does not feel much different to me.  She was advised to call in the morning to schedule an appointment with her primary care provider for further evaluation.  She was encouraged to continue her Tylenol or other medications that she has been given in the past if needed for pain.  She was advised to return to the emergency department for symptoms of change or worsen if she is unable to schedule an appointment with primary care.      ____________________________________________   FINAL CLINICAL IMPRESSION(S) / ED DIAGNOSES  Final diagnoses:  Fall in home, initial encounter  Traumatic hematoma of forehead, initial encounter  Eye contusion, right, initial encounter  Decreased sensation of lower extremity     ED Discharge Orders    None       Note:  This document was prepared using Dragon voice recognition software and may include unintentional dictation errors.    Victorino Dike, FNP 03/28/18 [place], [place], FNP 03/28/18 1551    Merlyn Lot, MD 03/29/18 1030

## 2018-04-02 ENCOUNTER — Other Ambulatory Visit: Payer: Self-pay | Admitting: Neurosurgery

## 2018-04-02 DIAGNOSIS — G8929 Other chronic pain: Secondary | ICD-10-CM

## 2018-04-02 DIAGNOSIS — C541 Malignant neoplasm of endometrium: Secondary | ICD-10-CM

## 2018-04-02 DIAGNOSIS — M5441 Lumbago with sciatica, right side: Secondary | ICD-10-CM

## 2018-04-02 DIAGNOSIS — M5442 Lumbago with sciatica, left side: Secondary | ICD-10-CM

## 2018-04-08 ENCOUNTER — Other Ambulatory Visit: Payer: Self-pay

## 2018-04-08 ENCOUNTER — Encounter: Payer: Self-pay | Admitting: Emergency Medicine

## 2018-04-08 ENCOUNTER — Emergency Department
Admission: EM | Admit: 2018-04-08 | Discharge: 2018-04-08 | Disposition: A | Discharge status: Home / Self Care | Payer: Medicare Other | Attending: Emergency Medicine | Admitting: Emergency Medicine

## 2018-04-08 DIAGNOSIS — E119 Type 2 diabetes mellitus without complications: Secondary | ICD-10-CM | POA: Diagnosis not present

## 2018-04-08 DIAGNOSIS — Z7982 Long term (current) use of aspirin: Secondary | ICD-10-CM | POA: Diagnosis not present

## 2018-04-08 DIAGNOSIS — I10 Essential (primary) hypertension: Secondary | ICD-10-CM | POA: Diagnosis not present

## 2018-04-08 DIAGNOSIS — M5431 Sciatica, right side: Secondary | ICD-10-CM | POA: Diagnosis not present

## 2018-04-08 DIAGNOSIS — Z79899 Other long term (current) drug therapy: Secondary | ICD-10-CM | POA: Diagnosis not present

## 2018-04-08 DIAGNOSIS — Z8542 Personal history of malignant neoplasm of other parts of uterus: Secondary | ICD-10-CM | POA: Insufficient documentation

## 2018-04-08 DIAGNOSIS — M79604 Pain in right leg: Secondary | ICD-10-CM

## 2018-04-08 DIAGNOSIS — Z7984 Long term (current) use of oral hypoglycemic drugs: Secondary | ICD-10-CM | POA: Diagnosis not present

## 2018-04-08 LAB — BASIC METABOLIC PANEL
Anion gap: 12 (ref 5–15)
BUN: 18 mg/dL (ref 8–23)
CHLORIDE: 98 mmol/L (ref 98–111)
CO2: 25 mmol/L (ref 22–32)
CREATININE: 0.66 mg/dL (ref 0.44–1.00)
Calcium: 9.7 mg/dL (ref 8.9–10.3)
GFR calc Af Amer: >60 mL/min (ref >60)
GFR calc non Af Amer: >60 mL/min (ref >60)
Glucose, Bld: 104 mg/dL — ABNORMAL HIGH (ref 70–99)
Potassium: 4.6 mmol/L (ref 3.5–5.1)
SODIUM: 135 mmol/L (ref 135–145)

## 2018-04-08 LAB — CBC
HEMATOCRIT: 32.9 % — AB (ref 36.0–46.0)
Hemoglobin: 10.7 g/dL — ABNORMAL LOW (ref 12.0–15.0)
MCH: 29.3 pg (ref 26.0–34.0)
MCHC: 32.5 g/dL (ref 30.0–36.0)
MCV: 90.1 fL (ref 80.0–100.0)
Platelets: 345 10*3/uL (ref 150–400)
RBC: 3.65 MIL/uL — ABNORMAL LOW (ref 3.87–5.11)
RDW: 14.7 % (ref 11.5–15.5)
WBC: 7.6 10*3/uL (ref 4.0–10.5)
nRBC: 0 % (ref 0.0–0.2)

## 2018-04-08 LAB — TROPONIN I: Troponin I: 0.06 ng/mL (ref <0.03)

## 2018-04-08 MED ORDER — NAPROXEN 500 MG PO TABS: 500.0000 mg | ORAL_TABLET | Freq: Two times a day (BID) | ORAL | 2 refills | Status: AC

## 2018-04-08 MED ORDER — KETOROLAC TROMETHAMINE 30 MG/ML IJ SOLN
15.0000 mg | Freq: Once | INTRAMUSCULAR | Status: AC
  Administered 2018-04-08: 15 mg via INTRAMUSCULAR
  Filled 2018-04-08: qty 1

## 2018-04-08 MED ORDER — OXYCODONE-ACETAMINOPHEN 5-325 MG PO TABS
2.0000 | ORAL_TABLET | Freq: Once | ORAL | Status: AC
  Administered 2018-04-08: 2 via ORAL
  Filled 2018-04-08: qty 2

## 2018-04-08 NOTE — ED Notes (Signed)
Date and time results received: 04/08/18 2:11 PM  (use smartphrase ".now" to insert current time)  Test: Trop Critical Value: 0.06  Name of Provider Notified: Dr. Corky Downs  Orders Received? Or Actions Taken?: Critical Results Acknowledged

## 2018-04-08 NOTE — ED Notes (Signed)
Pt reported stabbing and aching pain in her right hip which radiates down to her foot. Pt states that the  pain right now is 5/10 but around 9 in the morning she had pain 10/10 and took 2 home pain pill.

## 2018-04-08 NOTE — ED Triage Notes (Signed)
Says has right leg pain.  Talked to someone at [facility] and they told her to come here.  Took to oxy with no relief.  Has mri scheduled for tomorrow.

## 2018-04-08 NOTE — ED Provider Notes (Signed)
[facility] Emergency Department Provider Note   ____________________________________________    I have reviewed the triage vital signs and the nursing notes.   HISTORY  Chief Complaint Leg Pain     HPI Frances [name] is a 73 y.o. female who presents with complaints of right leg pain.  Patient is scheduled for MRI because of chronic pain in her right hip and right low back which is been causing sciatica type symptoms.  Patient reports the pain that she is having today is the same as usual except worse than typical.  She did take 2 Percocets prior to arrival and reports her pain is already improving.  No weakness.  She has an MRI scheduled for tomorrow, has been seeing neurosurgery.  No sensory changes.  Fevers or chills.  No chest pain or shortness of breath  Past Medical History:  Diagnosis Date  . Diabetes mellitus without complication ([place])   . Hypertension   . Personal history of chemotherapy current   for uterine ca  . Uterine cancer ([place])     There are no active problems to display for this patient.   Past Surgical History:  Procedure Laterality Date  . CHOLECYSTECTOMY  11-08-12   ERCP with stent(Dr Oh) and  Physiomesh herniation defect repair  . HAND SURGERY Left 2013   Carpel Tunnel   . TUBAL LIGATION  1980    Prior to Admission medications   Medication Sig Start Date End Date Taking? Authorizing Provider  aspirin EC 81 MG tablet Take 81 mg by mouth every evening.   Yes [provider]  diclofenac sodium (VOLTAREN) 1 % GEL Place 2 g onto the skin 4 (four) times daily. 12/12/16  Yes [provider]  gabapentin (NEURONTIN) 300 MG capsule Take 300 mg by mouth 3 (three) times daily.  01/22/16  Yes [provider]  lidocaine (XYLOCAINE) 2 % jelly Apply 1 application topically as needed. 01/22/18 01/22/19 Yes [provider]  lisinopril (PRINIVIL,ZESTRIL) 40 MG tablet Take 40 mg by mouth daily.  09/05/12   Yes [provider]  meloxicam (MOBIC) 7.5 MG tablet Take 7.5 mg by mouth 2 (two) times daily.  01/08/18  Yes [provider]  metFORMIN (GLUCOPHAGE) 500 MG tablet Take 1 tablet by mouth 2 (two) times daily with a meal.  09/04/12  Yes [provider]  oxyCODONE-acetaminophen (ROXICET) 5-325 MG tablet Take 1 tablet by mouth every 4 (four) hours as needed for severe pain. 11/20/16  Yes Gregor Hams, MD  acetaminophen (TYLENOL) 500 MG tablet Take 1,000 mg by mouth every 8 (eight) hours.    [provider]  cephALEXin (KEFLEX) 500 MG capsule Take 1 capsule (500 mg total) by mouth 3 (three) times daily. Patient not taking: Reported on 11/20/2016 05/29/16   Nance Pear, MD  colchicine 0.6 MG tablet Take 2 tabs PO x 1, then 1 tab PO 1 hour later x 1  Max: 1.8 mg total dose per attack, do not repeat within 3 days. 05/20/15   Menshew, Dannielle Karvonen, PA-C  HYDROcodone-acetaminophen (NORCO) 5-325 MG tablet Take 1 tablet by mouth every 6 (six) hours as needed for moderate pain. Patient not taking: Reported on 04/08/2018 05/20/15   Menshew, Dannielle Karvonen, PA-C  loratadine (CLARITIN) 10 MG tablet Take 10 mg by mouth daily. 04/17/16   [provider]  naproxen (NAPROSYN) 500 MG tablet Take 1 tablet (500 mg total) by mouth 2 (two) times daily with a meal.  04/08/18   Lavonia Drafts, MD  OLANZapine (ZYPREXA) 10 MG tablet Take 10 mg by mouth daily. 04/23/17   [provider]  ondansetron (ZOFRAN ODT) 4 MG disintegrating tablet Take 1 tablet (4 mg total) by mouth every 8 (eight) hours as needed for nausea or vomiting. 11/20/16   Gregor Hams, MD  oxyCODONE (OXY IR/ROXICODONE) 5 MG immediate release tablet Take 5-10 mg by mouth every 4 (four) hours as needed for pain. 03/26/18   [provider]     Allergies Patient has no known allergies.  Family History  Problem Relation Age of Onset  . Breast cancer Mother 28  . Breast cancer Sister         early 30's-twice    Social History Social History   Tobacco Use  . Smoking status: Never Smoker  . Smokeless tobacco: Never Used  Substance Use Topics  . Alcohol use: No  . Drug use: No    Review of Systems  Constitutional: No fever/chills Eyes: No visual changes.  ENT: No neck pain Cardiovascular: Denies chest pain. Respiratory: Denies shortness of breath. Gastrointestinal: No abdominal pain.  Genitourinary: Negative for dysuria. Musculoskeletal: As above Skin: Negative for rash. Neurological: Negative for headaches or weakness   ____________________________________________   PHYSICAL EXAM:  VITAL SIGNS: ED Triage Vitals  Enc Vitals Group     BP 04/08/18 1251 95/68     Pulse Rate 04/08/18 1251 (!) 109     Resp --      Temp 04/08/18 1251 98.6 F (37 C)     Temp Source 04/08/18 1251 Oral     SpO2 04/08/18 1251 100 %     Weight 04/08/18 1245 65.8 kg (145 lb)     Height 04/08/18 1245 1.753 m (5\' 9" )     Head Circumference --      Peak Flow --      Pain Score 04/08/18 1245 10     Pain Loc --      Pain Edu? --      Excl. in [place]? --     Constitutional: Alert and oriented. Eyes: Conjunctivae are normal.   Nose: No congestion/rhinnorhea. Mouth/Throat: Mucous membranes are moist.    Cardiovascular: Normal rate, regular rhythm.  Good peripheral circulation. Respiratory: Normal respiratory effort.  No retractions Gastrointestinal: Soft and nontender. No distention.  Musculoskeletal: Lower extremities warm and well perfused, patient is able to lift her right leg off the bed without difficulty, normal range of motion.  No bruising or pallor.  No saddle anesthesia Neurologic:  Normal speech and language. No gross focal neurologic deficits are appreciated.  Skin:  Skin is warm, dry and intact. No rash noted. Psychiatric: Mood and affect are normal. Speech and behavior are normal.  ____________________________________________   LABS (all labs ordered are listed,  but only abnormal results are displayed)  Labs Reviewed  BASIC METABOLIC PANEL - Abnormal; Notable for the following components:      Result Value   Glucose, Bld 104 (*)    All other components within normal limits  CBC - Abnormal; Notable for the following components:   RBC 3.65 (*)    Hemoglobin 10.7 (*)    HCT 32.9 (*)    All other components within normal limits  TROPONIN I - Abnormal; Notable for the following components:   Troponin I 0.06 (*)    All other components within normal limits   ____________________________________________  EKG  ED ECG REPORT I, Lavonia Drafts, the attending physician, personally  viewed and interpreted this ECG.  Date: 04/08/2018  Rhythm: Sinus tachycardia QRS Axis: normal Intervals: normal ST/T Wave abnormalities: normal Narrative Interpretation: no evidence of acute ischemia  ____________________________________________  RADIOLOGY   ____________________________________________   PROCEDURES  Procedure(s) performed: No  Procedures   Critical Care performed: No ____________________________________________   INITIAL IMPRESSION / ASSESSMENT AND PLAN / ED COURSE  Pertinent labs & imaging results that were available during my care of the patient were reviewed by me and considered in my medical decision making (see chart for details).  Patient well-appearing and in no acute distress, she appears to be having radicular symptoms which are not unusual for her, this seems to be an exacerbation of sciatica.  Her lab work is overall unremarkable, she has mild elevation in her troponin which is likely chronic, she has no cardiac symptoms and her EKG is unremarkable.  Patient was treated with additional Percocet and IM Toradol with significant improvement in her pain.  Appropriate for discharge at this point as she has good outpatient follow-up MRI scheduled for tomorrow.  No concerning new neurological findings     ____________________________________________   FINAL CLINICAL IMPRESSION(S) / ED DIAGNOSES  Final diagnoses:  Right leg pain  Sciatica of right side        Note:  This document was prepared using Dragon voice recognition software and may include unintentional dictation errors.   Lavonia Drafts, MD 04/08/18 [phone]

## 2018-04-09 ENCOUNTER — Other Ambulatory Visit: Payer: Self-pay

## 2018-04-09 ENCOUNTER — Emergency Department
Admission: EM | Admit: 2018-04-09 | Discharge: 2018-04-09 | Disposition: A | Discharge status: Home / Self Care | Payer: Medicare Other | Attending: Emergency Medicine | Admitting: Emergency Medicine

## 2018-04-09 ENCOUNTER — Emergency Department: Payer: Medicare Other

## 2018-04-09 ENCOUNTER — Ambulatory Visit
Admission: RE | Admit: 2018-04-09 | Discharge: 2018-04-09 | Disposition: A | Discharge status: Home / Self Care | Payer: Medicare Other | Source: Ambulatory Visit | Attending: Neurosurgery | Admitting: Neurosurgery

## 2018-04-09 DIAGNOSIS — M5442 Lumbago with sciatica, left side: Principal | ICD-10-CM

## 2018-04-09 DIAGNOSIS — Z79899 Other long term (current) drug therapy: Secondary | ICD-10-CM | POA: Diagnosis not present

## 2018-04-09 DIAGNOSIS — C55 Malignant neoplasm of uterus, part unspecified: Secondary | ICD-10-CM | POA: Diagnosis not present

## 2018-04-09 DIAGNOSIS — G8929 Other chronic pain: Secondary | ICD-10-CM

## 2018-04-09 DIAGNOSIS — I10 Essential (primary) hypertension: Secondary | ICD-10-CM | POA: Diagnosis not present

## 2018-04-09 DIAGNOSIS — Z7982 Long term (current) use of aspirin: Secondary | ICD-10-CM | POA: Diagnosis not present

## 2018-04-09 DIAGNOSIS — C541 Malignant neoplasm of endometrium: Secondary | ICD-10-CM

## 2018-04-09 DIAGNOSIS — M549 Dorsalgia, unspecified: Secondary | ICD-10-CM | POA: Diagnosis present

## 2018-04-09 DIAGNOSIS — M5441 Lumbago with sciatica, right side: Secondary | ICD-10-CM

## 2018-04-09 DIAGNOSIS — E119 Type 2 diabetes mellitus without complications: Secondary | ICD-10-CM | POA: Diagnosis not present

## 2018-04-09 DIAGNOSIS — C7951 Secondary malignant neoplasm of bone: Secondary | ICD-10-CM | POA: Insufficient documentation

## 2018-04-09 LAB — CBC WITH DIFFERENTIAL/PLATELET
ABS IMMATURE GRANULOCYTES: 0.03 10*3/uL (ref 0.00–0.07)
Basophils Absolute: 0 10*3/uL (ref 0.0–0.1)
Basophils Relative: 1 %
Eosinophils Absolute: 0.1 10*3/uL (ref 0.0–0.5)
Eosinophils Relative: 1 %
HCT: 29.4 % — ABNORMAL LOW (ref 36.0–46.0)
Hemoglobin: 9.5 g/dL — ABNORMAL LOW (ref 12.0–15.0)
IMMATURE GRANULOCYTES: 1 %
Lymphocytes Relative: 16 %
Lymphs Abs: 1 10*3/uL (ref 0.7–4.0)
MCH: 29.4 pg (ref 26.0–34.0)
MCHC: 32.3 g/dL (ref 30.0–36.0)
MCV: 91 fL (ref 80.0–100.0)
MONOS PCT: 7 %
Monocytes Absolute: 0.5 10*3/uL (ref 0.1–1.0)
NEUTROS PCT: 74 %
Neutro Abs: 4.8 10*3/uL (ref 1.7–7.7)
Platelets: 321 10*3/uL (ref 150–400)
RBC: 3.23 MIL/uL — ABNORMAL LOW (ref 3.87–5.11)
RDW: 14.9 % (ref 11.5–15.5)
WBC: 6.4 10*3/uL (ref 4.0–10.5)
nRBC: 0 % (ref 0.0–0.2)

## 2018-04-09 LAB — COMPREHENSIVE METABOLIC PANEL
ALT: 15 U/L (ref 0–44)
ANION GAP: 11 (ref 5–15)
AST: 38 U/L (ref 15–41)
Albumin: 3.1 g/dL — ABNORMAL LOW (ref 3.5–5.0)
Alkaline Phosphatase: 528 U/L — ABNORMAL HIGH (ref 38–126)
BUN: 26 mg/dL — ABNORMAL HIGH (ref 8–23)
CO2: 24 mmol/L (ref 22–32)
Calcium: 9.2 mg/dL (ref 8.9–10.3)
Chloride: 98 mmol/L (ref 98–111)
Creatinine, Ser: 0.73 mg/dL (ref 0.44–1.00)
GFR calc Af Amer: >60 mL/min (ref >60)
GFR calc non Af Amer: >60 mL/min (ref >60)
Glucose, Bld: 87 mg/dL (ref 70–99)
Potassium: 3.9 mmol/L (ref 3.5–5.1)
Sodium: 133 mmol/L — ABNORMAL LOW (ref 135–145)
Total Bilirubin: 0.7 mg/dL (ref 0.3–1.2)
Total Protein: 6.2 g/dL — ABNORMAL LOW (ref 6.5–8.1)

## 2018-04-09 LAB — HEMOGLOBIN AND HEMATOCRIT, BLOOD
HCT: 32.5 % — ABNORMAL LOW (ref 36.0–46.0)
Hemoglobin: 10.5 g/dL — ABNORMAL LOW (ref 12.0–15.0)

## 2018-04-09 LAB — TROPONIN I: Troponin I: 0.06 ng/mL (ref <0.03)

## 2018-04-09 MED ORDER — DIPHENHYDRAMINE HCL 50 MG/ML IJ SOLN
12.5000 mg | Freq: Once | INTRAMUSCULAR | Status: AC
  Administered 2018-04-09: 12.5 mg via INTRAVENOUS
  Filled 2018-04-09: qty 1

## 2018-04-09 MED ORDER — HYDROMORPHONE HCL 1 MG/ML IJ SOLN
0.5000 mg | Freq: Once | INTRAMUSCULAR | Status: AC
  Administered 2018-04-09: 0.5 mg via INTRAVENOUS
  Filled 2018-04-09: qty 1

## 2018-04-09 MED ORDER — ONDANSETRON HCL 4 MG/2ML IJ SOLN
4.0000 mg | Freq: Once | INTRAMUSCULAR | Status: AC
  Administered 2018-04-09: 4 mg via INTRAVENOUS
  Filled 2018-04-09: qty 2

## 2018-04-09 MED ORDER — HYDROMORPHONE HCL 1 MG/ML IJ SOLN
1.0000 mg | Freq: Once | INTRAMUSCULAR | Status: AC
  Administered 2018-04-09: 1 mg via INTRAVENOUS

## 2018-04-09 MED ORDER — SODIUM CHLORIDE 0.9 % IV BOLUS
500.0000 mL | Freq: Once | INTRAVENOUS | Status: AC
  Administered 2018-04-09: 500 mL via INTRAVENOUS

## 2018-04-09 MED ORDER — OXYCODONE-ACETAMINOPHEN 7.5-325 MG PO TABS: 1.0000 | ORAL_TABLET | ORAL | 0 refills | Status: DC | PRN

## 2018-04-09 MED ORDER — HYDROMORPHONE HCL 1 MG/ML IJ SOLN
INTRAMUSCULAR | Status: AC
  Filled 2018-04-09: qty 1

## 2018-04-09 MED ORDER — MIDAZOLAM HCL 2 MG/2ML IJ SOLN
1.0000 mg | Freq: Once | INTRAMUSCULAR | Status: AC
  Administered 2018-04-09: 1 mg via INTRAVENOUS

## 2018-04-09 MED ORDER — OXYCODONE-ACETAMINOPHEN 7.5-325 MG PO TABS: 1.0000 | ORAL_TABLET | ORAL | 0 refills | Status: AC | PRN

## 2018-04-09 MED ORDER — MIDAZOLAM HCL 2 MG/2ML IJ SOLN
INTRAMUSCULAR | Status: AC
  Administered 2018-04-09: 1 mg via INTRAVENOUS
  Filled 2018-04-09: qty 2

## 2018-04-09 MED ORDER — HYDROMORPHONE HCL 1 MG/ML IJ SOLN
INTRAMUSCULAR | Status: AC
  Administered 2018-04-09: 1 mg via INTRAVENOUS
  Filled 2018-04-09: qty 1

## 2018-04-09 NOTE — ED Provider Notes (Signed)
[facility] Emergency Department Provider Note   ____________________________________________   First MD Initiated Contact with Patient 04/09/18 [phone]     (approximate)  I have reviewed the triage vital signs and the nursing notes.   HISTORY  Chief Complaint Generalized Body Aches    HPI Orian P Rotert is a 73 y.o. female who was going to MRI for her back leg and buttocks pain but the pain was too bad she could not lay still so she came here.  She took 1 Percocet 05/23/2023 prior to coming in.  We will try some more pain meds and see if we can get her to lay still.  She says this is the pain that she is having is not diffuse achy all over is the same pain she is getting the MRI for which is too bad for her to lay still.        Past Medical History:  Diagnosis Date   Diabetes mellitus without complication ([place])    Hypertension    Personal history of chemotherapy current   for uterine ca   Uterine cancer ([place])     There are no active problems to display for this patient.   Past Surgical History:  Procedure Laterality Date   CHOLECYSTECTOMY  11-08-12   ERCP with stent(Dr Oh) and  Physiomesh herniation defect repair   HAND SURGERY Left 2013   [facility]    Prior to Admission medications   Medication Sig Start Date End Date Taking? Authorizing Provider  aspirin EC 81 MG tablet Take 81 mg by mouth every evening.   Yes [provider]  diclofenac sodium (VOLTAREN) 1 % GEL Place 2 g onto the skin 4 (four) times daily. 12/12/16  Yes [provider]  gabapentin (NEURONTIN) 300 MG capsule Take 300 mg by mouth 3 (three) times daily.  01/22/16  Yes [provider]  lisinopril (PRINIVIL,ZESTRIL) 40 MG tablet Take 40 mg by mouth daily.  09/05/12  Yes [provider]  meloxicam (MOBIC) 7.5 MG tablet Take 7.5 mg by mouth 2 (two) times daily.  01/08/18  Yes [provider]    metFORMIN (GLUCOPHAGE) 500 MG tablet Take 1 tablet by mouth 2 (two) times daily with a meal.  09/04/12  Yes [provider]  oxyCODONE-acetaminophen (ROXICET) 5-325 MG tablet Take 1 tablet by mouth every 4 (four) hours as needed for severe pain. 11/20/16  Yes Gregor Hams, MD  acetaminophen (TYLENOL) 500 MG tablet Take 1,000 mg by mouth every 8 (eight) hours.    [provider]  cephALEXin (KEFLEX) 500 MG capsule Take 1 capsule (500 mg total) by mouth 3 (three) times daily. Patient not taking: Reported on 11/20/2016 05/29/16   Nance Pear, MD  colchicine 0.6 MG tablet Take 2 tabs PO x 1, then 1 tab PO 1 hour later x 1  Max: 1.8 mg total dose per attack, do not repeat within 3 days. 05/20/15   Menshew, Dannielle Karvonen, PA-C  HYDROcodone-acetaminophen (NORCO) 5-325 MG tablet Take 1 tablet by mouth every 6 (six) hours as needed for moderate pain. Patient not taking: Reported on 04/08/2018 05/20/15   Menshew, Dannielle Karvonen, PA-C  lidocaine (XYLOCAINE) 2 % jelly Apply 1 application topically as needed. 01/22/18 01/22/19  [provider]  loratadine (CLARITIN) 10 MG tablet Take 10 mg by mouth daily. 04/17/16   [provider]  naproxen (NAPROSYN) 500 MG tablet Take 1 tablet (500  mg total) by mouth 2 (two) times daily with a meal. 04/08/18   Lavonia Drafts, MD  OLANZapine (ZYPREXA) 10 MG tablet Take 10 mg by mouth daily. 04/23/17   [provider]  ondansetron (ZOFRAN ODT) 4 MG disintegrating tablet Take 1 tablet (4 mg total) by mouth every 8 (eight) hours as needed for nausea or vomiting. 11/20/16   Gregor Hams, MD  oxyCODONE (OXY IR/ROXICODONE) 5 MG immediate release tablet Take 5-10 mg by mouth every 4 (four) hours as needed for pain. 03/26/18   [provider]    Allergies Patient has no known allergies.  Family History  Problem Relation Age of Onset   Breast cancer Mother 40   Breast cancer Sister        early 30's-twice    Social  History Social History   Tobacco Use   Smoking status: Never Smoker   Smokeless tobacco: Never Used  Substance Use Topics   Alcohol use: No   Drug use: No    Review of Systems  Constitutional: No fever/chills Eyes: No visual changes. ENT: No sore throat. Cardiovascular: Denies chest pain. Respiratory: Denies shortness of breath. Gastrointestinal: No abdominal pain.  No nausea, no vomiting.  No diarrhea.  No constipation. Genitourinary: Negative for dysuria. Musculoskeletal:  back pain. Skin: Negative for rash. Neurological: Negative for headaches, focal weakness   ____________________________________________   PHYSICAL EXAM:  VITAL SIGNS: ED Triage Vitals  Enc Vitals Group     BP 04/09/18 0852 (!) 117/57     Pulse Rate 04/09/18 0852 (!) 103     Resp 04/09/18 0852 18     Temp 04/09/18 0852 (!) 97.5 F (36.4 C)     Temp Source 04/09/18 0852 Oral     SpO2 04/09/18 0852 94 %     Weight 04/09/18 0853 145 lb (65.8 kg)     Height 04/09/18 0853 5\' 9"  (1.753 m)     Head Circumference --      Peak Flow --      Pain Score 04/09/18 0852 10     Pain Loc --      Pain Edu? --      Excl. in [place]? --     Constitutional: Alert and oriented. Well appearing and in no acute distress. Eyes: Conjunctivae are normal.  Head: Atraumatic there is a lump on the right forehead with some swelling and skin scarring. Nose: No congestion/rhinnorhea. Mouth/Throat: Mucous membranes are moist.  Oropharynx non-erythematous. Neck: No stridor.   Cardiovascular: Good peripheral circulation. Respiratory: Normal respiratory effort.   Musculoskeletal: No lower extremity edema.   Neurologic:  Normal speech and language. No gross focal neurologic deficits are appreciated on brief exam. Skin:  Skin is warm, dry and intact. Marland Kitchen Psychiatric: Mood and affect are normal. Speech and behavior are normal.  ____________________________________________   LABS (all labs ordered are listed, but only abnormal  results are displayed)  Labs Reviewed  COMPREHENSIVE METABOLIC PANEL - Abnormal; Notable for the following components:      Result Value   Sodium 133 (*)    BUN 26 (*)    Total Protein 6.2 (*)    Albumin 3.1 (*)    Alkaline Phosphatase 528 (*)    All other components within normal limits  CBC WITH DIFFERENTIAL/PLATELET - Abnormal; Notable for the following components:   RBC 3.23 (*)    Hemoglobin 9.5 (*)    HCT 29.4 (*)    All other components within normal limits  TROPONIN I -  Abnormal; Notable for the following components:   Troponin I 0.06 (*)    All other components within normal limits  HEMOGLOBIN AND HEMATOCRIT, BLOOD - Abnormal; Notable for the following components:   Hemoglobin 10.5 (*)    HCT 32.5 (*)    All other components within normal limits   ____________________________________________  EKG   ____________________________________________  RADIOLOGY  ED MD interpretation: CT and MRI showed multiple metastasis there is a sacral fracture.  Official radiology report(s): Ct Thoracic Spine Wo Contrast  Result Date: 04/09/2018 CLINICAL DATA:  Metastatic uterine cancer. Thoracic and lumbar back pain and bilateral leg pain. Severely motion degraded MRI earlier today. EXAM: CT THORACIC AND LUMBAR SPINE WITHOUT CONTRAST TECHNIQUE: Multidetector CT imaging of the thoracic and lumbar spine was performed without contrast. Multiplanar CT image reconstructions were also generated. COMPARISON:  Thoracic and lumbar spine MRI 04/09/2018 FINDINGS: CT THORACIC SPINE FINDINGS Alignment: Exaggerated thoracic kyphosis.  No significant listhesis. Vertebrae: No acute fracture. Lytic lesions in the T2, T8, T9, and T10 vertebral bodies corresponding to the lesions described on earlier MRI. The T10 lesion disrupts the posterior vertebral body cortex with evidence of small volume ventral epidural tumor not resulting in significant spinal stenosis. Paraspinal and other soft tissues: Asymmetric  left thyroid lobe enlargement with slight extension into the superior mediastinum. Partially visualized 7 cm right upper lobe pulmonary mass with possible chest wall invasion laterally. 2.3 cm left upper lobe lung nodule. 1.3 cm short axis right internal mammary lymph node. Multiple enlarged mediastinal lymph nodes including a 1.7 cm short axis lower right paratracheal/precarinal node. Coronary artery calcification. Disc levels: Disc space narrowing and spurring throughout the thoracic spine without evidence of significant osseous spinal or neural foraminal stenosis. CT LUMBAR SPINE FINDINGS Segmentation: Standard. Alignment: Grade 1 anterolisthesis of L3 on L4 and L4 on L5, facet mediated. Vertebrae: No acute fracture in the lumbar spine. Scattered small Schmorl's nodes. Lytic vertebral body lesions most conspicuous at L4, better demonstrated on the earlier MRI. Destructive right sacral mass measuring at least 5.9 x 5.7 cm, incompletely imaged. Associated pathologic fracture involving the right hemisacrum. Possibly nondisplaced left sacral ala fracture as well. Diffusely abnormal appearance of the included left ilium with mixed sclerosis and osseous erosion, periosteal reaction, and abnormal extraosseous soft tissue extending into the iliac fossa as well as posterior to the ileum, incompletely imaged. Paraspinal and other soft tissues: Aortic atherosclerosis. Retroperitoneal lymphadenopathy including a 2.2 cm short axis left para-aortic node. Prior cholecystectomy. Disc levels: Advanced disc and facet degeneration from L3-4 to L5-S1, more fully evaluated on the earlier MRI. IMPRESSION: 1. Metastases in the thoracic and lumbar spine as described on earlier MRI. Small volume ventral epidural tumor at T10 without evidence of significant spinal stenosis. 2. 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 3. Partially imaged aggressive lesion involving the left ilium. 4. 7 cm right upper lobe  pulmonary mass with possible chest wall invasion. 5. 2.3 cm left upper lobe pulmonary nodule. 6. Thoracic and retroperitoneal lymphadenopathy. 7.  Aortic Atherosclerosis (ICD10-I70.0). Electronically Signed   By: Logan Bores M.D.   On: 04/09/2018 14:13   Ct Lumbar Spine Wo Contrast  Result Date: 04/09/2018 CLINICAL DATA:  Metastatic uterine cancer. Thoracic and lumbar back pain and bilateral leg pain. Severely motion degraded MRI earlier today. EXAM: CT THORACIC AND LUMBAR SPINE WITHOUT CONTRAST TECHNIQUE: Multidetector CT imaging of the thoracic and lumbar spine was performed without contrast. Multiplanar CT image reconstructions were also generated. COMPARISON:  Thoracic and lumbar  spine MRI 04/09/2018 FINDINGS: CT THORACIC SPINE FINDINGS Alignment: Exaggerated thoracic kyphosis.  No significant listhesis. Vertebrae: No acute fracture. Lytic lesions in the T2, T8, T9, and T10 vertebral bodies corresponding to the lesions described on earlier MRI. The T10 lesion disrupts the posterior vertebral body cortex with evidence of small volume ventral epidural tumor not resulting in significant spinal stenosis. Paraspinal and other soft tissues: Asymmetric left thyroid lobe enlargement with slight extension into the superior mediastinum. Partially visualized 7 cm right upper lobe pulmonary mass with possible chest wall invasion laterally. 2.3 cm left upper lobe lung nodule. 1.3 cm short axis right internal mammary lymph node. Multiple enlarged mediastinal lymph nodes including a 1.7 cm short axis lower right paratracheal/precarinal node. Coronary artery calcification. Disc levels: Disc space narrowing and spurring throughout the thoracic spine without evidence of significant osseous spinal or neural foraminal stenosis. CT LUMBAR SPINE FINDINGS Segmentation: Standard. Alignment: Grade 1 anterolisthesis of L3 on L4 and L4 on L5, facet mediated. Vertebrae: No acute fracture in the lumbar spine. Scattered small Schmorl's  nodes. Lytic vertebral body lesions most conspicuous at L4, better demonstrated on the earlier MRI. Destructive right sacral mass measuring at least 5.9 x 5.7 cm, incompletely imaged. Associated pathologic fracture involving the right hemisacrum. Possibly nondisplaced left sacral ala fracture as well. Diffusely abnormal appearance of the included left ilium with mixed sclerosis and osseous erosion, periosteal reaction, and abnormal extraosseous soft tissue extending into the iliac fossa as well as posterior to the ileum, incompletely imaged. Paraspinal and other soft tissues: Aortic atherosclerosis. Retroperitoneal lymphadenopathy including a 2.2 cm short axis left para-aortic node. Prior cholecystectomy. Disc levels: Advanced disc and facet degeneration from L3-4 to L5-S1, more fully evaluated on the earlier MRI. IMPRESSION: 1. Metastases in the thoracic and lumbar spine as described on earlier MRI. Small volume ventral epidural tumor at T10 without evidence of significant spinal stenosis. 2. 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 3. Partially imaged aggressive lesion involving the left ilium. 4. 7 cm right upper lobe pulmonary mass with possible chest wall invasion. 5. 2.3 cm left upper lobe pulmonary nodule. 6. Thoracic and retroperitoneal lymphadenopathy. 7.  Aortic Atherosclerosis (ICD10-I70.0). Electronically Signed   By: Logan Bores M.D.   On: 04/09/2018 14:13   Mr Thoracic Spine Wo Contrast  Result Date: 04/09/2018 CLINICAL DATA:  Thoracic and lumbar back pain and pain in the legs. History of metastatic uterine cancer. EXAM: MRI THORACIC AND LUMBAR SPINE WITHOUT CONTRAST TECHNIQUE: Multiplanar and multiecho pulse sequences of the thoracic and lumbar spine were obtained without intravenous contrast. COMPARISON:  Lumbar spine MRI 10/08/2017 FINDINGS: The patient was in pain and was unable to lie still for the examination. The studies are severely motion degraded, particularly  the thoracic spine MRI which is of very limited diagnostic utility. For this reason, it was decided not to administer IV contrast. MRI THORACIC SPINE FINDINGS Alignment: Left convex curvature of the thoracic spine. Increased thoracic kyphosis. Vertebrae: Large T1 hypointense, STIR hyperintense lesions in the T2, T8, T9, and T10 vertebral bodies. Limited assessment for extraosseous tumor. No gross vertebral body height loss to indicate pathologic fracture. Cord:  No gross cord compression. Paraspinal and other soft tissues: No gross paraspinal soft tissue edema on the sagittal STIR sequence. Disc levels: Disc degeneration with what appears to be mostly mild disc bulging and spurring predominantly in the midthoracic spine, poorly evaluated on this study. No evidence of high-grade spinal stenosis. MRI LUMBAR SPINE FINDINGS Segmentation:  Standard. Alignment: Mild  lumbar levoscoliosis. Unchanged grade 1 anterolisthesis of L3 on L4 and L4 on L5, facet mediated. Vertebrae: New T1 hypointense lesions in the L1, L2, and L4 vertebral bodies. Scattered small Schmorl's nodes. Partially visualized extensive T1 hypointensity and variable STIR hyperintensity throughout the included upper portion of the sacrum bilaterally with partly masslike appearance (particularly on the right), not imaged axially. Partially visualized marrow replacement in the left ilium is well. Conus medullaris and cauda equina: Conus extends to the L1 level. Conus and cauda equina appear normal. Paraspinal and other soft tissues: No gross abnormality. Disc levels: Limited assessment of degenerative changes due to motion artifact. Chronic moderate disc space narrowing at L3-4 and L4-5 and ankylosis across the L5-S1 disc space. Chronic severe spinal stenosis and moderate bilateral neural foraminal stenosis at L3-4 due to anterolisthesis with bulging uncovered disc and severe facet and ligamentum flavum hypertrophy. Unchanged mild right neural foraminal  stenosis at L4-5 due to anterolisthesis with bulging uncovered disc and severe facet hypertrophy. IMPRESSION: 1. Severely motion degraded examinations, particularly in the thoracic spine. 2. Evidence of relatively widespread osseous metastases involving the thoracic spine, lumbar spine, and pelvis. Partial imaging of extensive sacral tumor with an underlying sacral fracture not excluded. 3. Limited assessment for extra osseous tumor in the spine. No gross cord compression. 4. Chronic severe spinal stenosis at L3-4. Electronically Signed   By: Logan Bores M.D.   On: 04/09/2018 12:03   Mr Lumbar Spine Wo Contrast  Result Date: 04/09/2018 CLINICAL DATA:  Thoracic and lumbar back pain and pain in the legs. History of metastatic uterine cancer. EXAM: MRI THORACIC AND LUMBAR SPINE WITHOUT CONTRAST TECHNIQUE: Multiplanar and multiecho pulse sequences of the thoracic and lumbar spine were obtained without intravenous contrast. COMPARISON:  Lumbar spine MRI 10/08/2017 FINDINGS: The patient was in pain and was unable to lie still for the examination. The studies are severely motion degraded, particularly the thoracic spine MRI which is of very limited diagnostic utility. For this reason, it was decided not to administer IV contrast. MRI THORACIC SPINE FINDINGS Alignment: Left convex curvature of the thoracic spine. Increased thoracic kyphosis. Vertebrae: Large T1 hypointense, STIR hyperintense lesions in the T2, T8, T9, and T10 vertebral bodies. Limited assessment for extraosseous tumor. No gross vertebral body height loss to indicate pathologic fracture. Cord:  No gross cord compression. Paraspinal and other soft tissues: No gross paraspinal soft tissue edema on the sagittal STIR sequence. Disc levels: Disc degeneration with what appears to be mostly mild disc bulging and spurring predominantly in the midthoracic spine, poorly evaluated on this study. No evidence of high-grade spinal stenosis. MRI LUMBAR SPINE FINDINGS  Segmentation:  Standard. Alignment: Mild lumbar levoscoliosis. Unchanged grade 1 anterolisthesis of L3 on L4 and L4 on L5, facet mediated. Vertebrae: New T1 hypointense lesions in the L1, L2, and L4 vertebral bodies. Scattered small Schmorl's nodes. Partially visualized extensive T1 hypointensity and variable STIR hyperintensity throughout the included upper portion of the sacrum bilaterally with partly masslike appearance (particularly on the right), not imaged axially. Partially visualized marrow replacement in the left ilium is well. Conus medullaris and cauda equina: Conus extends to the L1 level. Conus and cauda equina appear normal. Paraspinal and other soft tissues: No gross abnormality. Disc levels: Limited assessment of degenerative changes due to motion artifact. Chronic moderate disc space narrowing at L3-4 and L4-5 and ankylosis across the L5-S1 disc space. Chronic severe spinal stenosis and moderate bilateral neural foraminal stenosis at L3-4 due to anterolisthesis with bulging uncovered disc  and severe facet and ligamentum flavum hypertrophy. Unchanged mild right neural foraminal stenosis at L4-5 due to anterolisthesis with bulging uncovered disc and severe facet hypertrophy. IMPRESSION: 1. Severely motion degraded examinations, particularly in the thoracic spine. 2. Evidence of relatively widespread osseous metastases involving the thoracic spine, lumbar spine, and pelvis. Partial imaging of extensive sacral tumor with an underlying sacral fracture not excluded. 3. Limited assessment for extra osseous tumor in the spine. No gross cord compression. 4. Chronic severe spinal stenosis at L3-4. Electronically Signed   By: Logan Bores M.D.   On: 04/09/2018 12:03    ____________________________________________   PROCEDURES  Procedure(s) performed (including Critical Care):  Procedures   ____________________________________________   INITIAL IMPRESSION / ASSESSMENT AND PLAN / ED  COURSE  Discussed patient with Dr. Marry Guan who feels that the patient metastasis is too large for them to do anything with it here.  Dr. Rudene Christians also reviewed the film doctor who told me and does not think that we can do anything about the sacral fracture here.  I discussed the patient with Dr. Cari Caraway. Dr. Cari Caraway offered to take the patient to [facility].  He also thinks is okay the patient goes home.  The patient does want to go home.  I did describe to her the fact that she has multiple bony metastasis of the cancer and that if she falls she could break many bones at once.  This could be disastrous.  She understands.  She does want to go home I will let her go home her regular docs will continue to follow-up with her.             ____________________________________________   FINAL CLINICAL IMPRESSION(S) / ED DIAGNOSES  Final diagnoses:  Bony metastasis [facility])     ED Discharge Orders    None       Note:  This document was prepared using Dragon voice recognition software and may include unintentional dictation errors.    Nena Polio, MD 04/09/18 [phone]

## 2018-04-09 NOTE — ED Notes (Signed)
Rectal exam with Dr. Cinda Quest negative for blood.

## 2018-04-09 NOTE — Discharge Instructions (Addendum)
Please be very careful.  Remember not to fall.  A fall could result in many fractures.  Use the pain medicine as needed. I will give you Percocet 7-1/2 mg.  Take 1 pill 4 times a day.  If you need more you can try 2 but be very careful, that will be very strong.  Remember the Percocet can make you woozy, be careful not to fall.  It can also make you constipated.  You may want to take some stool softener or MiraLAX. Please return if worse.  Please be sure to follow-up with your regular doctors and your GYN oncologist.

## 2018-04-09 NOTE — ED Triage Notes (Addendum)
Pt was scheduled for an mri this am but was unable to lie still for the test due to pain, pt is having pain in her lower back, mid back, and bilat legs.pt is also having burning in her back and hips. Pt reports hx of uterine ca

## 2018-04-09 NOTE — ED Notes (Signed)
Pt taken to MRI with this RN and Dr. Cinda Quest. Pt given a total of 2mg  dilaudid and 1mg  versed per verbal order from Dr. Cinda Quest for pain/relaxation to get through MRI. Pt pain at a 0 and pt placed on 2L [place] while in MRI and pulse ox continuously measured during scan at 100%. Dr. Cinda Quest to place verbal med orders.

## 2018-04-09 NOTE — ED Notes (Signed)
Patient transported to CT 

## 2018-05-21 DEATH — deceased

## 2018-11-23 IMAGING — CT CT ABD-PELV W/ CM
2 of 5 series · 14 of 46 positions shown, 16 images · IV contrast (APPLIED)
Comparison: Abdominal CT 05/29/2016

CLINICAL DATA: Right upper quadrant and epigastric abdominal pain.

EXAM:
CT ABDOMEN AND PELVIS WITH CONTRAST
TECHNIQUE: Multidetector CT imaging of the abdomen and pelvis was performed
using the standard protocol following bolus administration of
intravenous contrast.
CONTRAST:  100mL ASWLH9-UZZ IOPAMIDOL (ASWLH9-UZZ) INJECTION 61%

[Series 2: routine abd/pel with · axial · 0.72mm/px · z∈[-890,-490]mm · 11 of 90 slices shown, 13 images]
[im 5/90  soft-tissue]
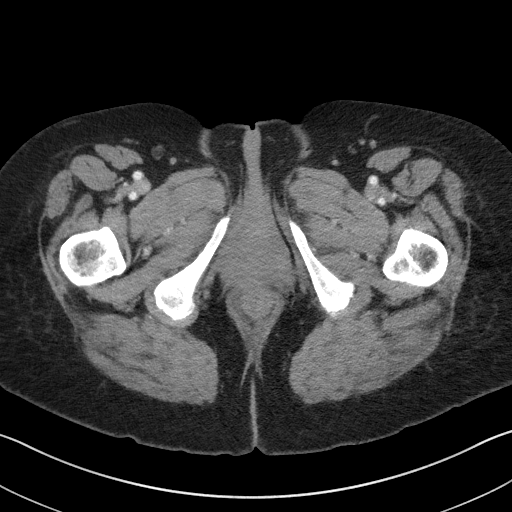
[im 5/90  bone]
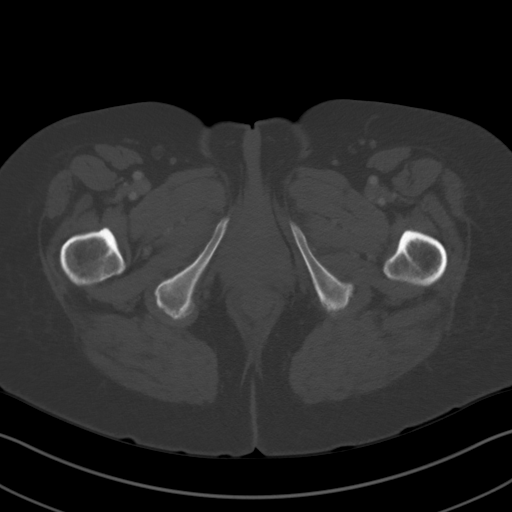
[im 15/90  soft-tissue]
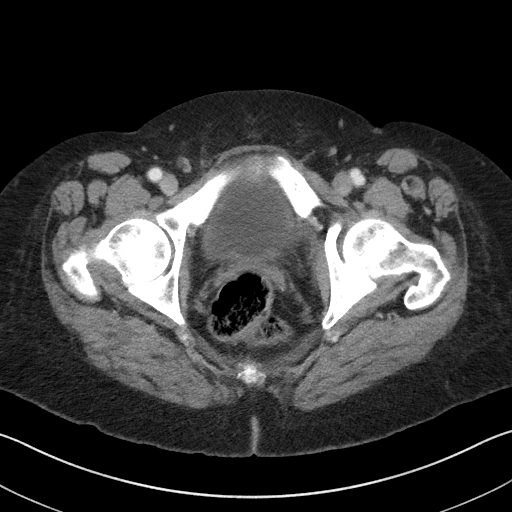
[im 24/90  soft-tissue]
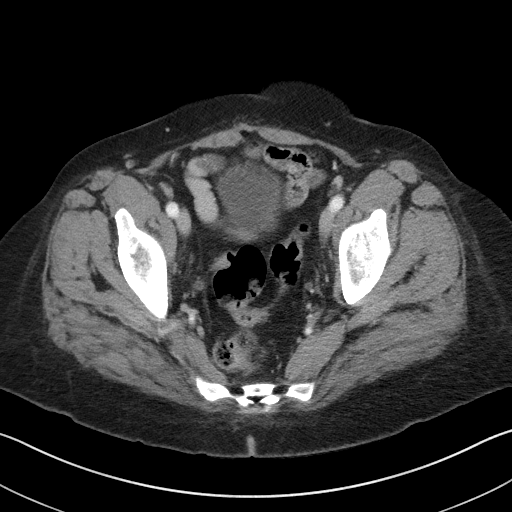
[im 29/90  soft-tissue]
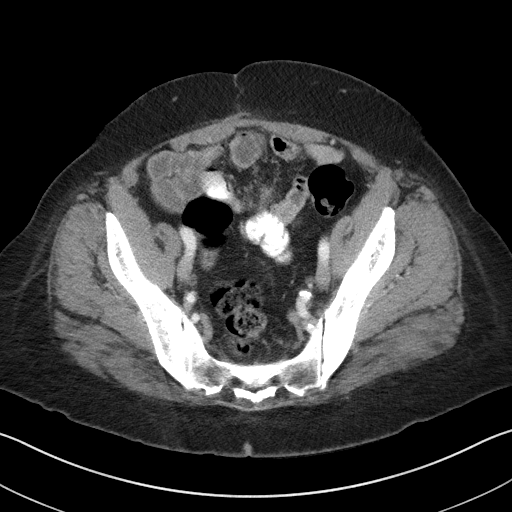
[im 38/90  soft-tissue]
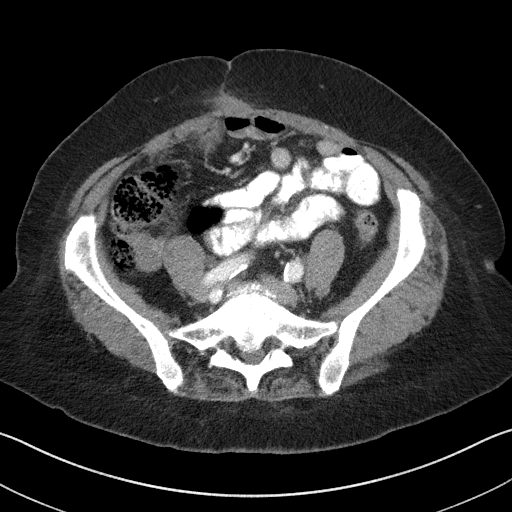
[im 47/90  soft-tissue]
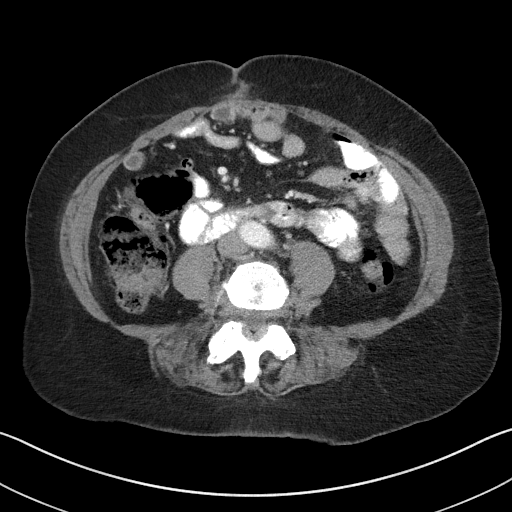
[im 52/90  soft-tissue]
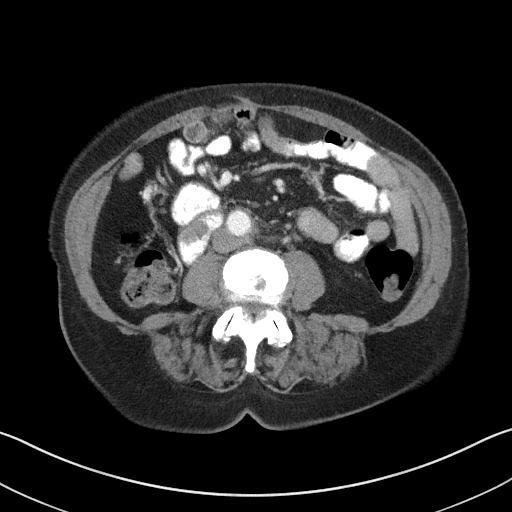
[im 61/90  soft-tissue]
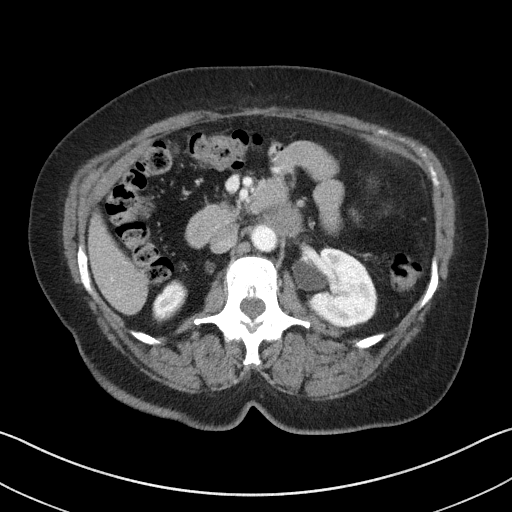
[im 66/90  soft-tissue]
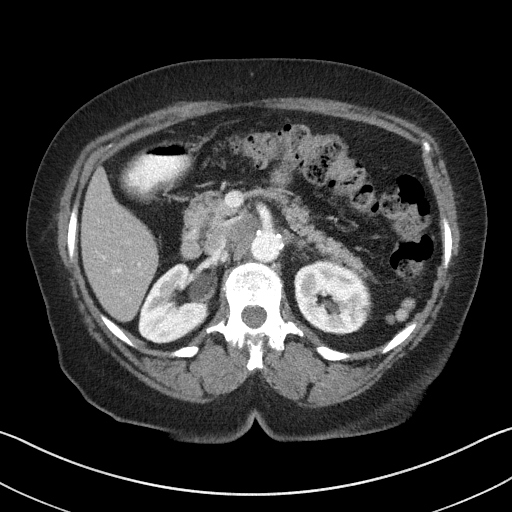
[im 66/90  bone]
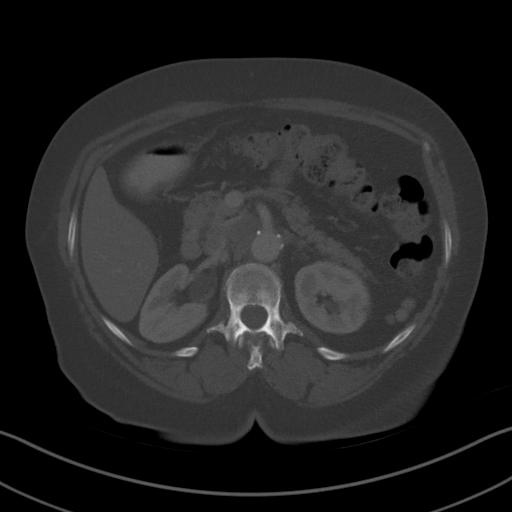
[im 75/90  soft-tissue]
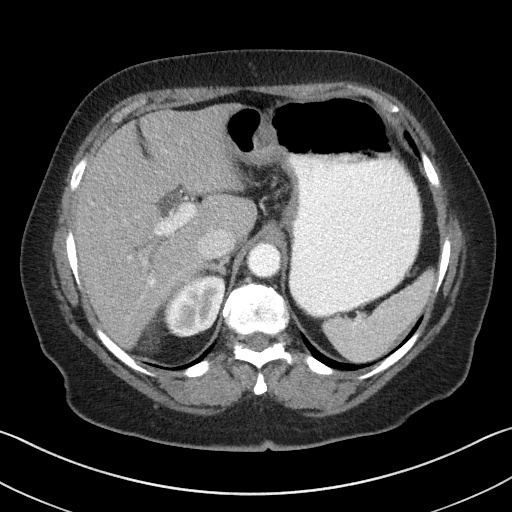
[im 85/90  soft-tissue]
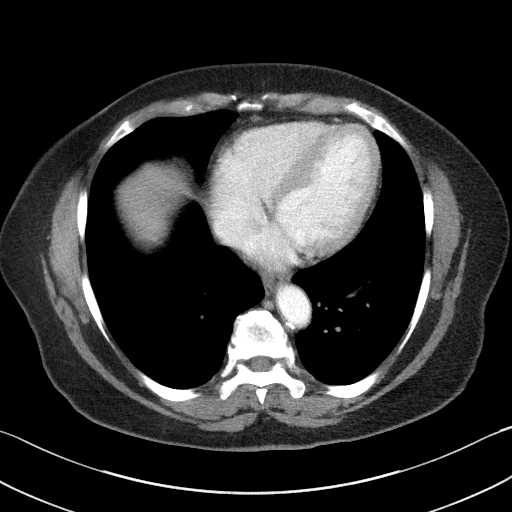

[Series 5: coronal st · coronal · 0.65mm/px · 3 of 88 slices shown]
[im 30/88  soft-tissue]
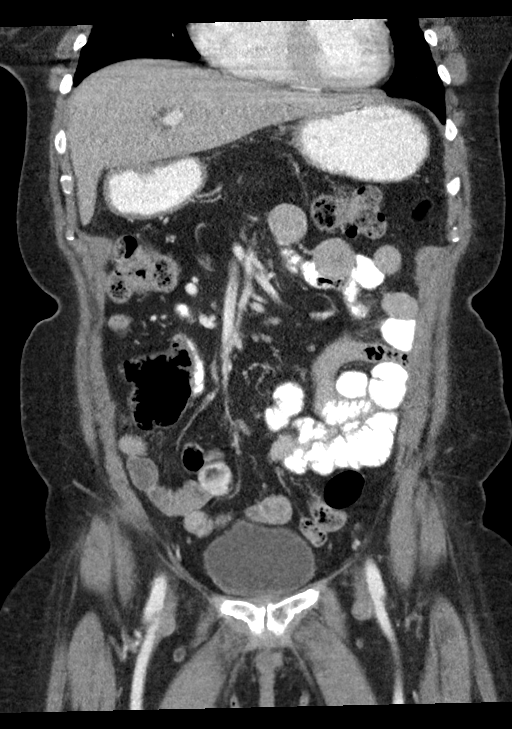
[im 39/88  soft-tissue]
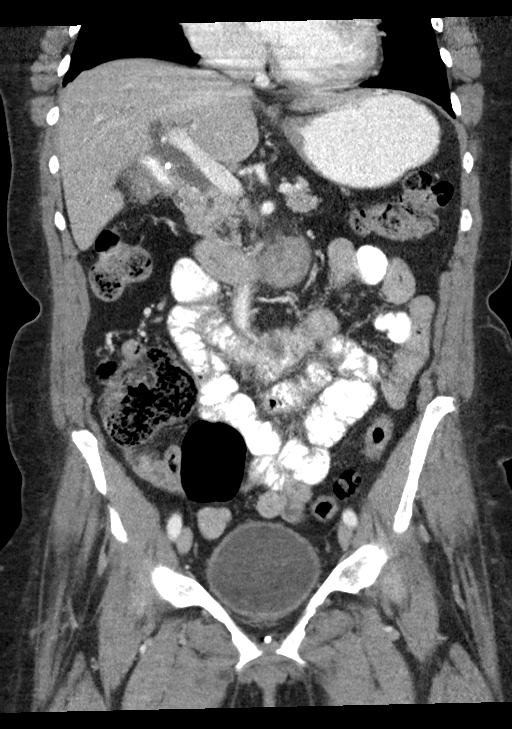
[im 49/88  soft-tissue]
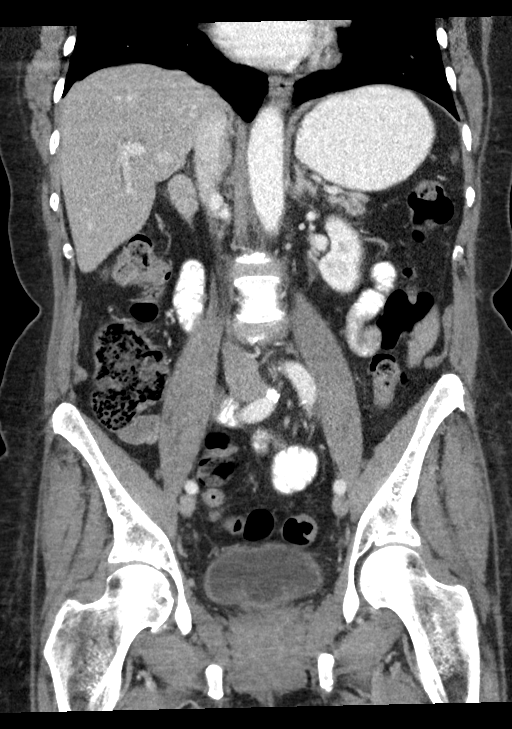

[14 of 46 positions shown; findings below may reference images not displayed]

FINDINGS: Lower chest: No consolidation or pleural fluid.

Hepatobiliary: No focal hepatic lesion. Postcholecystectomy with
decreased pneumobilia from prior exam. There is intra and
extrahepatic biliary dilatation with common bile duct measuring 15
mm at the porta hepatis.

Pancreas: Parenchymal atrophy. No ductal dilatation or inflammation.

Spleen: Normal in size without focal abnormality.

Adrenals/Urinary Tract: No adrenal nodule. No hydronephrosis.
Extrarenal pelvis configuration bilaterally. Homogeneous renal
enhancement with symmetric excretion on delayed phase imaging. Tiny
subcentimeter hypodensity in the anterior left kidney is too small
to characterize. Urinary bladder is physiologically distended. No
bladder wall thickening.

Stomach/Bowel: Stomach distended with ingested contrast. No gastric
wall thickening. No abnormal small bowel distention, inflammation or
wall thickening. Normal appendix. Moderate colonic stool burden
without colonic wall thickening.

Vascular/Lymphatic: Left periaortic retroperitoneal soft tissue
mass/ lymph node measures 3.3 x 2.9 cm, at the level of the lower
left kidney, overall similar in size allowing for differences in
caliper placement. There is decreased surrounding soft tissue
stranding from prior exam. New 2.5 x 1.8 cm aortocaval node at the
level of the SMA. Additional smaller retroperitoneal nodes are not
enlarged by size criteria. There is no pelvic adenopathy. Normal
caliber abdominal aorta with moderate atherosclerosis. No aneurysm.

Reproductive: Status post hysterectomy. No adnexal masses.

Other: No ascites. No free air. Postsurgical changes the anterior
abdominal wall. Tiny fat containing upper ventral abdominal wall
hernia.

Musculoskeletal: Advanced facet arthropathy in the lower lumbar
spine with grade 1 anterolisthesis of L3 on L4 and L4 on L5. No
acute osseous abnormality. No focal osseous lesion is seen.
IMPRESSION: 1. Persistent enlarged retroperitoneal lymph node, similar in size
to prior allowing for differences in caliper placement. The previous
surrounding inflammatory stranding has resolved. There is a new
enlarged retroperitoneal node at the level of the SMA. Given
reported history of uterine cancer, findings are concerning for
metastatic disease. Lymphoma is also considered but felt less
likely. Consider PET-CT or tissue sampling.
2. Postcholecystectomy with increased biliary dilatation from prior.
Given normal LFTs, this is likely postsurgical.
3. Distended stomach without gastric wall thickening or findings of
gastric outlet obstruction, can be seen with gastroparesis.
4.  Aortic Atherosclerosis ([id number].J).

## 2018-12-12 IMAGING — CT CT BIOPSY
1 of 2 series · 11 of 32 positions shown, 17 images · non-contrast
Comparison: none

INDICATION: 71-year-old with history of endometrial cancer and enlarged
retroperitoneal lymph nodes.

[Series 2: i-spiral 5.0 b30f · axial · 0.73mm/px · z∈[-150,-74]mm · 11 of 28 slices shown, 17 images]
[im 3/28  soft-tissue]
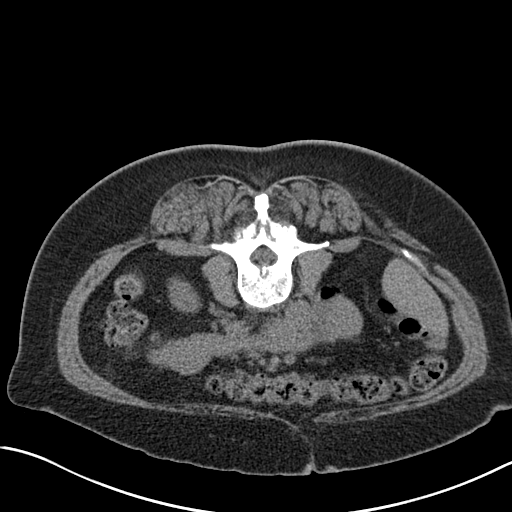
[im 3/28  bone]
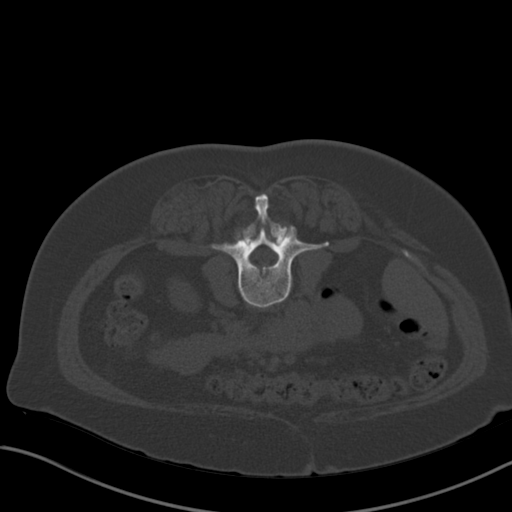
[im 5/28  soft-tissue]
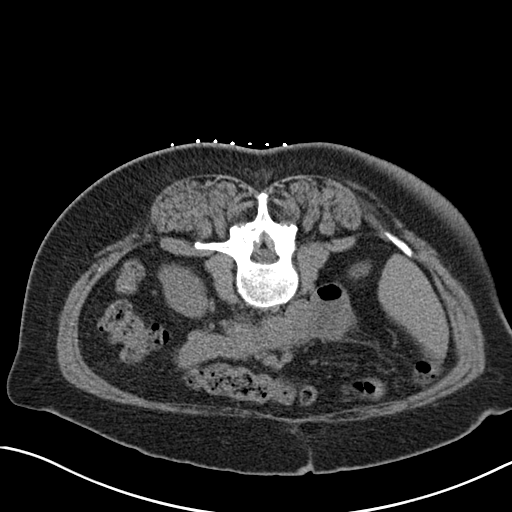
[im 7/28  soft-tissue]
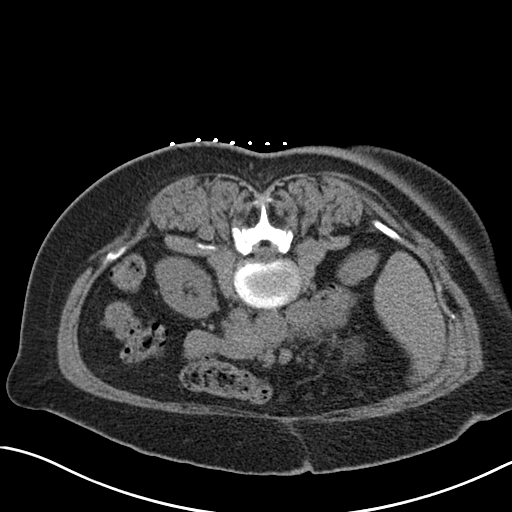
[im 10/28  soft-tissue]
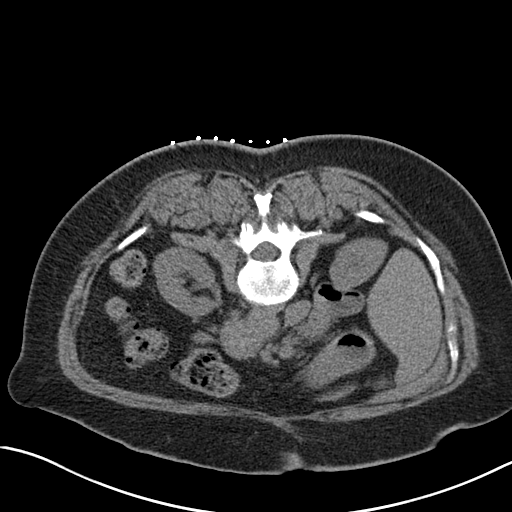
[im 12/28  soft-tissue]
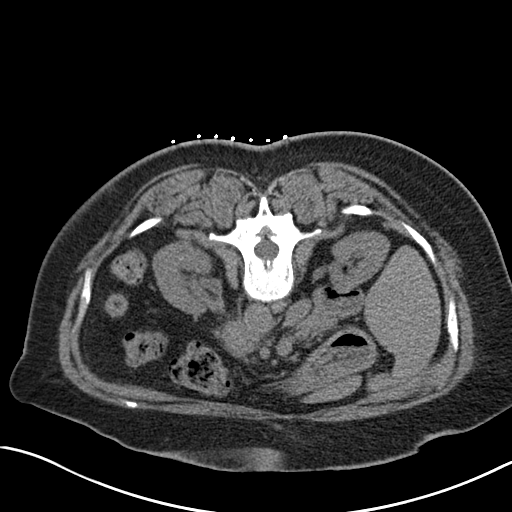
[im 14/28  soft-tissue]
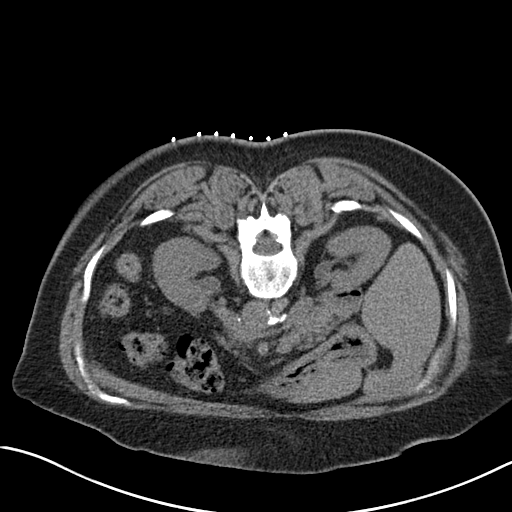
[im 16/28  soft-tissue]
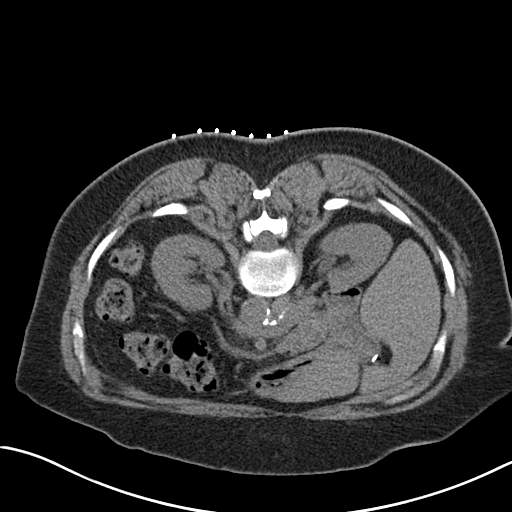
[im 19/28  soft-tissue]
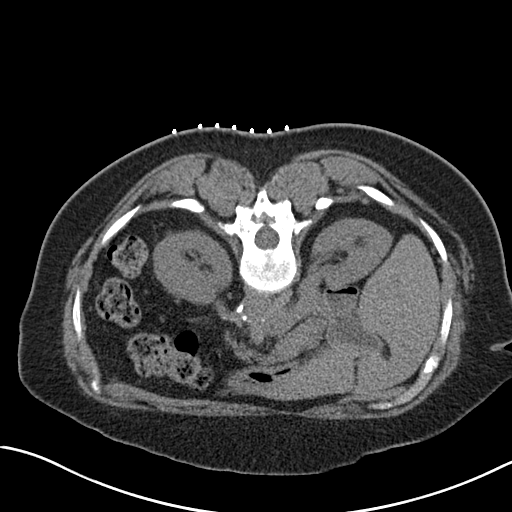
[im 19/28  lung]
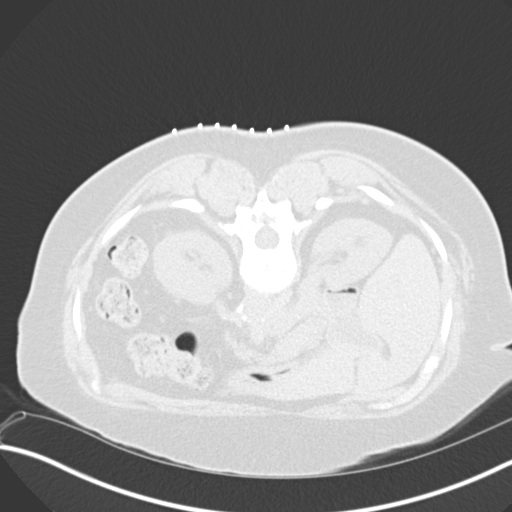
[im 21/28  soft-tissue]
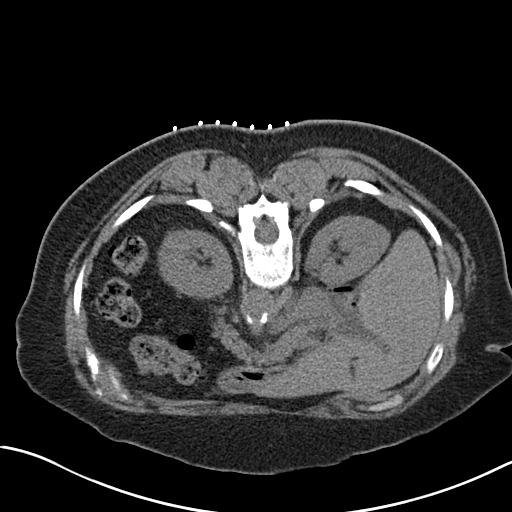
[im 21/28  lung]
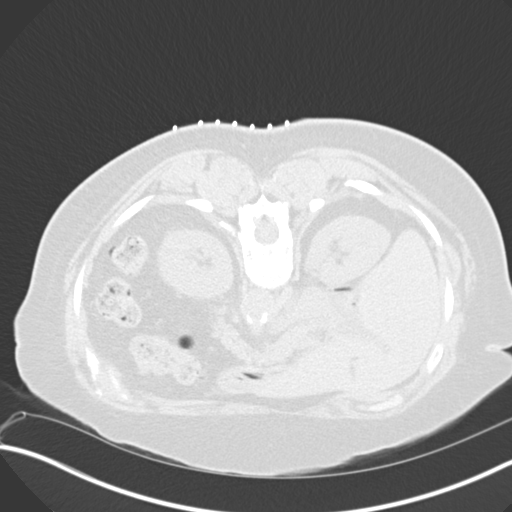
[im 21/28  bone]
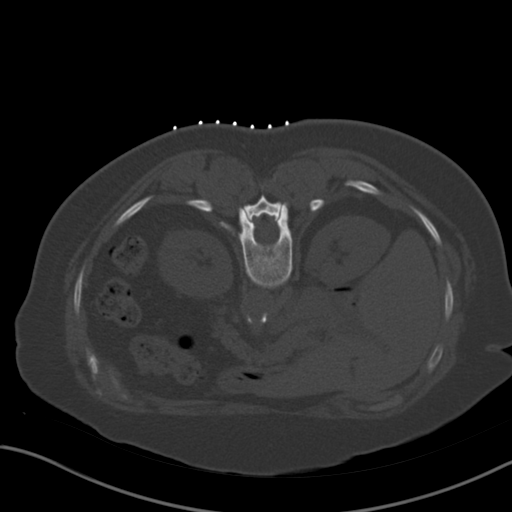
[im 23/28  soft-tissue]
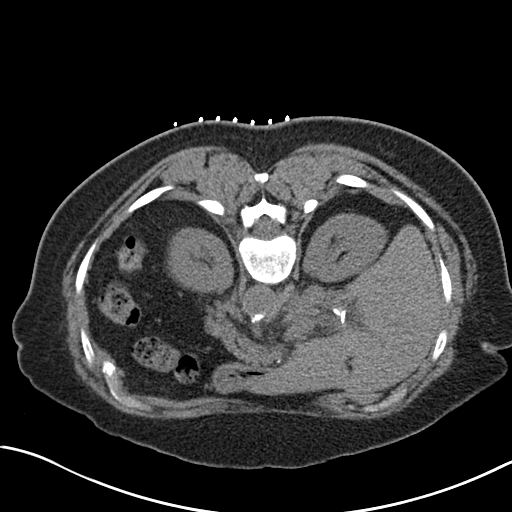
[im 23/28  lung]
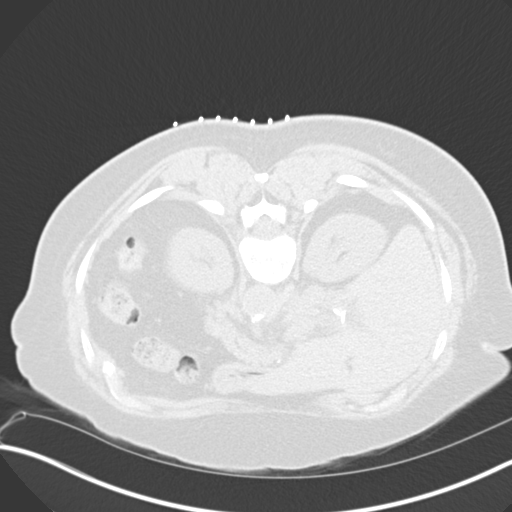
[im 25/28  soft-tissue]
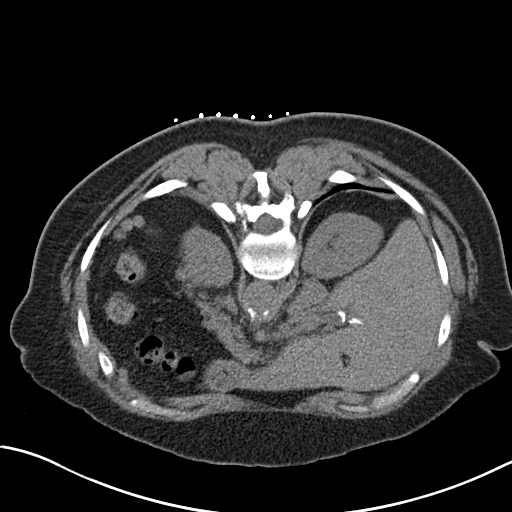
[im 25/28  lung]
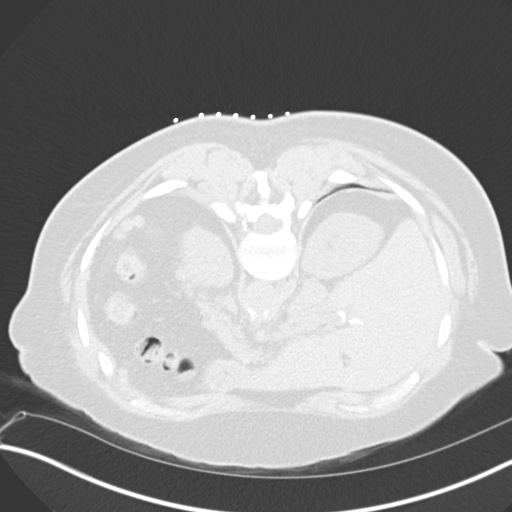

[11 of 32 positions shown; findings below may reference images not displayed]

EXAM:
CT-GUIDED BIOPSY OF LEFT RETROPERITONEAL LYMPH NODE

MEDICATIONS:
None.

ANESTHESIA/SEDATION:
Moderate (conscious) sedation was employed during this procedure. A
total of Versed 1.0 mg and Fentanyl 50 mcg was administered
intravenously.

Moderate Sedation Time: 31 minutes. The patient's level of
consciousness and vital signs were monitored continuously by
radiology nursing throughout the procedure under my direct
supervision.

FLUOROSCOPY TIME:  None

COMPLICATIONS:
None immediate.

PROCEDURE:
Informed written consent was obtained from the patient after a
thorough discussion of the procedural risks, benefits and
alternatives. All questions were addressed. A timeout was performed
prior to the initiation of the procedure.

Patient was placed prone on the CT scanner. Images through the
abdomen were obtained. The left retroperitoneal lymphadenopathy was
identified. Left side of the back was prepped and draped in a
sterile fashion. Skin and soft tissues were anesthetized with 1%
lidocaine. A 17 gauge coaxial needle was directed into the left
retroperitoneal lymphadenopathy from a paraspinal approach using CT
guidance. Needle position was confirmed along the posterior aspect
of the enlarged lymph nodes. Multiple 18 gauge core biopsies were
obtained. One specimen was placed on a Telfa pad for flow cytometry.
The other specimens were placed in formalin. Needle was removed
without complication. Bandage placed over the puncture site.
FINDINGS: Re- demonstration of left retroperitoneal lymphadenopathy. Needle
position was confirmed within the posterior aspect of the
lymphadenopathy. Multiple small core biopsies were obtained. No
significant bleeding or hematoma formation following the core
biopsies.
IMPRESSION: Successful CT-guided left retroperitoneal lymph node biopsy.
